# Patient Record
Sex: Female | Born: 1975 | Race: Black or African American | Hispanic: No | Marital: Single | Smoking: Never smoker
Health system: Southern US, Community
[De-identification: ages and names within clinical notes are randomized; demographics above are authoritative.]

## PROBLEM LIST (undated history)

## (undated) DIAGNOSIS — T8859XA Other complications of anesthesia, initial encounter: Secondary | ICD-10-CM

## (undated) DIAGNOSIS — M1990 Unspecified osteoarthritis, unspecified site: Secondary | ICD-10-CM

## (undated) DIAGNOSIS — G4730 Sleep apnea, unspecified: Secondary | ICD-10-CM

## (undated) DIAGNOSIS — J40 Bronchitis, not specified as acute or chronic: Secondary | ICD-10-CM

## (undated) DIAGNOSIS — I10 Essential (primary) hypertension: Secondary | ICD-10-CM

## (undated) HISTORY — PX: KNEE SURGERY: SHX244

---

## 2008-02-24 ENCOUNTER — Ambulatory Visit: Payer: Self-pay | Admitting: Internal Medicine

## 2008-03-01 ENCOUNTER — Ambulatory Visit: Payer: Self-pay | Admitting: *Deleted

## 2008-03-27 ENCOUNTER — Emergency Department (HOSPITAL_COMMUNITY): Admission: EM | Admit: 2008-03-27 | Discharge: 2008-03-27 | Payer: Self-pay | Admitting: Emergency Medicine

## 2008-05-24 ENCOUNTER — Ambulatory Visit: Payer: Self-pay | Admitting: Internal Medicine

## 2008-08-11 ENCOUNTER — Emergency Department (HOSPITAL_COMMUNITY): Admission: EM | Admit: 2008-08-11 | Discharge: 2008-08-11 | Payer: Self-pay | Admitting: Emergency Medicine

## 2008-10-11 ENCOUNTER — Ambulatory Visit: Payer: Self-pay | Admitting: Internal Medicine

## 2008-10-14 ENCOUNTER — Ambulatory Visit: Payer: Self-pay | Admitting: Internal Medicine

## 2008-12-09 ENCOUNTER — Ambulatory Visit: Payer: Self-pay | Admitting: Internal Medicine

## 2009-01-16 ENCOUNTER — Emergency Department (HOSPITAL_COMMUNITY): Admission: EM | Admit: 2009-01-16 | Discharge: 2009-01-16 | Payer: Self-pay | Admitting: Family Medicine

## 2009-05-26 ENCOUNTER — Encounter: Payer: Self-pay | Admitting: *Deleted

## 2009-08-10 ENCOUNTER — Emergency Department (HOSPITAL_COMMUNITY): Admission: EM | Admit: 2009-08-10 | Discharge: 2009-08-10 | Payer: Self-pay | Admitting: Family Medicine

## 2010-10-08 LAB — URINE CULTURE: Colony Count: 6000

## 2010-10-08 LAB — POCT PREGNANCY, URINE: Preg Test, Ur: NEGATIVE

## 2010-10-08 LAB — POCT URINALYSIS DIP (DEVICE): pH: 5.5 (ref 5.0–8.0)

## 2010-10-30 LAB — POCT URINALYSIS DIP (DEVICE)
Nitrite: NEGATIVE
Protein, ur: NEGATIVE mg/dL
pH: 7 (ref 5.0–8.0)

## 2010-11-06 LAB — URINE CULTURE: Colony Count: 30000

## 2010-11-06 LAB — POCT URINALYSIS DIP (DEVICE)
Nitrite: NEGATIVE
Specific Gravity, Urine: 1.015 (ref 1.005–1.030)
Urobilinogen, UA: 0.2 mg/dL (ref 0.0–1.0)
pH: 5 (ref 5.0–8.0)

## 2012-06-17 ENCOUNTER — Encounter (HOSPITAL_COMMUNITY): Payer: Self-pay | Admitting: Emergency Medicine

## 2012-06-17 ENCOUNTER — Emergency Department (HOSPITAL_COMMUNITY)
Admission: EM | Admit: 2012-06-17 | Discharge: 2012-06-17 | Disposition: A | Discharge status: Home / Self Care | Payer: No Typology Code available for payment source | Attending: Emergency Medicine | Admitting: Emergency Medicine

## 2012-06-17 DIAGNOSIS — I10 Essential (primary) hypertension: Secondary | ICD-10-CM | POA: Insufficient documentation

## 2012-06-17 DIAGNOSIS — M545 Low back pain, unspecified: Secondary | ICD-10-CM | POA: Insufficient documentation

## 2012-06-17 DIAGNOSIS — S4980XA Other specified injuries of shoulder and upper arm, unspecified arm, initial encounter: Secondary | ICD-10-CM | POA: Insufficient documentation

## 2012-06-17 DIAGNOSIS — S46909A Unspecified injury of unspecified muscle, fascia and tendon at shoulder and upper arm level, unspecified arm, initial encounter: Secondary | ICD-10-CM | POA: Insufficient documentation

## 2012-06-17 DIAGNOSIS — Z79899 Other long term (current) drug therapy: Secondary | ICD-10-CM | POA: Insufficient documentation

## 2012-06-17 DIAGNOSIS — Y92410 Unspecified street and highway as the place of occurrence of the external cause: Secondary | ICD-10-CM | POA: Insufficient documentation

## 2012-06-17 DIAGNOSIS — Y9389 Activity, other specified: Secondary | ICD-10-CM | POA: Insufficient documentation

## 2012-06-17 HISTORY — DX: Essential (primary) hypertension: I10

## 2012-06-17 MED ORDER — HYDROCODONE-ACETAMINOPHEN 5-325 MG PO TABS: 1.0000 | ORAL_TABLET | ORAL | Status: DC | PRN

## 2012-06-17 MED ORDER — CYCLOBENZAPRINE HCL 10 MG PO TABS: 10.0000 mg | ORAL_TABLET | Freq: Three times a day (TID) | ORAL | Status: DC | PRN
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
## 2012-06-17 NOTE — ED Notes (Signed)
Lower back "I'm sitting here and I feel a pulling in the low back". Dull ache in low back
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
## 2012-08-04 DIAGNOSIS — Z8709 Personal history of other diseases of the respiratory system: Secondary | ICD-10-CM | POA: Insufficient documentation

## 2012-08-04 DIAGNOSIS — R0789 Other chest pain: Secondary | ICD-10-CM | POA: Insufficient documentation

## 2012-08-04 DIAGNOSIS — Z79899 Other long term (current) drug therapy: Secondary | ICD-10-CM | POA: Insufficient documentation

## 2012-08-04 DIAGNOSIS — I10 Essential (primary) hypertension: Secondary | ICD-10-CM | POA: Insufficient documentation

## 2012-08-05 ENCOUNTER — Emergency Department (HOSPITAL_COMMUNITY)
Admit: 2012-08-05 | Discharge: 2012-08-05 | Disposition: A | Discharge status: Home / Self Care | Payer: BC Managed Care – PPO | Attending: Emergency Medicine | Admitting: Emergency Medicine

## 2012-08-05 ENCOUNTER — Encounter (HOSPITAL_COMMUNITY): Payer: Self-pay | Admitting: *Deleted

## 2012-08-05 ENCOUNTER — Emergency Department (HOSPITAL_COMMUNITY): Payer: BC Managed Care – PPO

## 2012-08-05 ENCOUNTER — Emergency Department (HOSPITAL_COMMUNITY)
Admission: EM | Admit: 2012-08-05 | Discharge: 2012-08-05 | Disposition: A | Discharge status: Home / Self Care | Payer: BC Managed Care – PPO | Attending: Emergency Medicine | Admitting: Emergency Medicine

## 2012-08-05 DIAGNOSIS — R079 Chest pain, unspecified: Secondary | ICD-10-CM

## 2012-08-05 HISTORY — DX: Bronchitis, not specified as acute or chronic: J40

## 2012-08-05 LAB — POCT I-STAT, CHEM 8
Chloride: 102 mEq/L (ref 96–112)
Glucose, Bld: 84 mg/dL (ref 70–99)
HCT: 35 % — ABNORMAL LOW (ref 36.0–46.0)
Hemoglobin: 11.9 g/dL — ABNORMAL LOW (ref 12.0–15.0)
Potassium: 3.3 mEq/L — ABNORMAL LOW (ref 3.5–5.1)
Sodium: 140 mEq/L (ref 135–145)

## 2012-08-05 LAB — CBC
HCT: 33.6 % — ABNORMAL LOW (ref 36.0–46.0)
Hemoglobin: 11.2 g/dL — ABNORMAL LOW (ref 12.0–15.0)
RDW: 15.2 % (ref 11.5–15.5)
WBC: 15.5 10*3/uL — ABNORMAL HIGH (ref 4.0–10.5)

## 2012-08-05 MED ORDER — ALBUTEROL SULFATE HFA 108 (90 BASE) MCG/ACT IN AERS: 1.0000 | INHALATION_SPRAY | Freq: Four times a day (QID) | RESPIRATORY_TRACT | Status: AC | PRN

## 2012-08-05 MED ORDER — IBUPROFEN 800 MG PO TABS: 800.0000 mg | ORAL_TABLET | Freq: Three times a day (TID) | ORAL | Status: AC

## 2012-08-05 MED ORDER — IOHEXOL 350 MG/ML SOLN
100.0000 mL | Freq: Once | INTRAVENOUS | Status: AC | PRN
  Administered 2012-08-05: 100 mL via INTRAVENOUS

## 2012-08-05 MED ORDER — ALBUTEROL SULFATE (5 MG/ML) 0.5% IN NEBU
5.0000 mg | INHALATION_SOLUTION | Freq: Once | RESPIRATORY_TRACT | Status: AC
Start: 2012-08-05 — End: 2012-08-05
  Administered 2012-08-05: 5 mg via RESPIRATORY_TRACT
  Filled 2012-08-05: qty 1

## 2012-08-05 NOTE — ED Provider Notes (Signed)
History     CSN: [id number]  Arrival date & time 08/04/12  2348   First MD Initiated Contact with Patient 08/05/12 [phone]      Chief Complaint  Patient presents with  . Shortness of Breath    (Consider location/radiation/quality/duration/timing/severity/associated sxs/prior treatment) HPI Hx per PT, SOB with CP, R sided pain sharp in quality, also described as burning. No leg pain or swelling. No known alleviating factors, no F/C, slight dry cough. Brother has sarcoisodis. No FH or DVT/ PE. MOD in severity but now very mild and PT declines and medications.  Past Medical History  Diagnosis Date  . Hypertension   . Bronchitis     Past Surgical History  Procedure Date  . Knee surgery     "titanium rod in left knee"    History reviewed. No pertinent family history.  History  Substance Use Topics  . Smoking status: Never Smoker   . Smokeless tobacco: Not on file  . Alcohol Use: Yes    OB History    Grav Para Term Preterm Abortions TAB SAB Ect Mult Living                  Review of Systems  Constitutional: Negative for fever and chills.  HENT: Negative for neck pain and neck stiffness.   Eyes: Negative for pain.  Respiratory: Positive for shortness of breath.   Cardiovascular: Positive for chest pain.  Gastrointestinal: Negative for abdominal pain.  Genitourinary: Negative for dysuria.  Musculoskeletal: Negative for back pain.  Skin: Negative for rash.  Neurological: Negative for headaches.  All other systems reviewed and are negative.    Allergies  Other  Home Medications   Current Outpatient Rx  Name  Route  Sig  Dispense  Refill  . SIMVASTATIN 20 MG PO TABS   Oral   Take 20 mg by mouth every evening.         Marland Kitchen VALSARTAN-HYDROCHLOROTHIAZIDE 160-25 MG PO TABS   Oral   Take 1 tablet by mouth daily.           BP 123/84  Pulse 95  Temp 98.4 F (36.9 C) (Oral)  Resp 18  SpO2 95%  Physical Exam  Constitutional: She is oriented to person,  place, and time. She appears well-developed and well-nourished.  HENT:  Head: Normocephalic and atraumatic.  Eyes: Conjunctivae normal and EOM are normal. Pupils are equal, round, and reactive to light.  Neck: Trachea normal. Neck supple. No thyromegaly present.  Cardiovascular: Normal rate, regular rhythm, S1 normal, S2 normal and normal pulses.     No systolic murmur is present   No diastolic murmur is present  Pulses:      Radial pulses are 2+ on the right side, and 2+ on the left side.  Pulmonary/Chest: Effort normal and breath sounds normal. She has no wheezes. She has no rhonchi. She has no rales. She exhibits no tenderness.  Abdominal: Soft. Normal appearance and bowel sounds are normal. There is no tenderness. There is no CVA tenderness and negative Murphy's sign.  Musculoskeletal: Normal range of motion. She exhibits no edema and no tenderness.       BLE:s Calves nontender, no cords or erythema, negative Homans sign  Neurological: She is alert and oriented to person, place, and time. She has normal strength. No cranial nerve deficit or sensory deficit. GCS eye subscore is 4. GCS verbal subscore is 5. GCS motor subscore is 6.  Skin: Skin is warm and dry. No rash noted.  She is not diaphoretic.  Psychiatric: Her speech is normal.       Cooperative and appropriate    ED Course  Procedures (including critical care time)  Results for orders placed during the hospital encounter of 08/05/12  CBC      Component Value Range   WBC 15.5 (*) 4.0 - 10.5 K/uL   RBC 4.25  3.87 - 5.11 MIL/uL   Hemoglobin 11.2 (*) 12.0 - 15.0 g/dL   HCT 16.1 (*) 09.6 - 04.5 %   MCV 79.1  78.0 - 100.0 fL   MCH 26.4  26.0 - 34.0 pg   MCHC 33.3  30.0 - 36.0 g/dL   RDW 40.9  81.1 - 91.4 %   Platelets 398  150 - 400 K/uL  POCT I-STAT, CHEM 8      Component Value Range   Sodium 140  135 - 145 mEq/L   Potassium 3.3 (*) 3.5 - 5.1 mEq/L   Chloride 102  96 - 112 mEq/L   BUN 20  6 - 23 mg/dL   Creatinine, Ser  7.82  0.50 - 1.10 mg/dL   Glucose, Bld 84  70 - 99 mg/dL   Calcium, Ion 9.56  2.13 - 1.23 mmol/L   TCO2 27  0 - 100 mmol/L   Hemoglobin 11.9 (*) 12.0 - 15.0 g/dL   HCT 08.6 (*) 57.8 - 46.9 %   Dg Chest 2 View  08/05/2012  *RADIOLOGY REPORT*  Clinical Data: Cough, shortness of breath.  CHEST - 2 VIEW  Comparison: None.  Findings: Mild perihilar interstitial prominence.  No focal infiltrate.  Heart size normal.  No effusion.  Regional bones unremarkable.  IMPRESSION:  Mild perihilar interstitial prominence, of uncertain chronicity.   Original Report Authenticated By: D. Hassell III, MD    Ct Angio Chest Pe W/cm &/or Wo Cm  08/05/2012  *RADIOLOGY REPORT*  Clinical Data: Tingling in the lower legs and pain in the right rib cage.  Shortness of breath.  CT ANGIOGRAPHY CHEST  Technique:  Multidetector CT imaging of the chest using the standard protocol during bolus administration of intravenous contrast. Multiplanar reconstructed images including MIPs were obtained and reviewed to evaluate the vascular anatomy.  Contrast: OMNIPAQUE IOHEXOL 350 MG/ML SOLN  Comparison: None.  Findings: Technically adequate study with moderately good opacification of the central and segmental pulmonary arteries.  No focal filling defects.  No evidence of significant central pulmonary emboli.  Normal heart size.  Normal caliber thoracic aorta.  Anterior mediastinal soft tissue is likely thymus.  Esophagus is decompressed.  No significant lymphadenopathy in the chest. Visualized portions of the upper abdominal organs are unremarkable. No pleural effusion.  No focal airspace consolidation.  Airways appear patent.  No pneumothorax.  Degenerative changes in the spine.  IMPRESSION: No evidence of significant pulmonary embolus.   Original Report Authenticated By: Burman Nieves, M.D.      Date: 08/05/2012  Rate: 96  Rhythm: normal sinus rhythm  QRS Axis: normal  Intervals: normal  ST/T Wave abnormalities: nonspecific ST/T  changes  Conduction Disutrbances:none  Narrative Interpretation:   Old EKG Reviewed: none available  Recheck after albuterol - feeling better, no PE on Ct scan. Plan d/c home with referrals provided. PT states understanding all d/c and f/u instructions.   MDM  SOB/ intermittent CP improved with albuterol. Work up as above. CT, CXR, ECG and labs reviewed. Plan outpatient follow up.         Sunnie Nielsen, MD 08/06/12 [phone]

## 2012-08-05 NOTE — ED Notes (Signed)
Pt transported to CT ?

## 2012-08-05 NOTE — ED Notes (Signed)
Pt states she was recently treated for bronchitis.  Today, c/o tingling in "lower lungs" and pain in right "rib cage".

## 2017-01-25 ENCOUNTER — Encounter (HOSPITAL_COMMUNITY): Payer: Self-pay | Admitting: Emergency Medicine

## 2017-01-25 ENCOUNTER — Emergency Department (HOSPITAL_COMMUNITY)
Admission: EM | Admit: 2017-01-25 | Discharge: 2017-01-26 | Disposition: A | Discharge status: Home / Self Care | Payer: BC Managed Care – PPO | Attending: Emergency Medicine | Admitting: Emergency Medicine

## 2017-01-25 DIAGNOSIS — K219 Gastro-esophageal reflux disease without esophagitis: Secondary | ICD-10-CM | POA: Insufficient documentation

## 2017-01-25 DIAGNOSIS — I10 Essential (primary) hypertension: Secondary | ICD-10-CM | POA: Diagnosis not present

## 2017-01-25 DIAGNOSIS — R079 Chest pain, unspecified: Secondary | ICD-10-CM | POA: Diagnosis present

## 2017-01-25 DIAGNOSIS — Z79899 Other long term (current) drug therapy: Secondary | ICD-10-CM | POA: Diagnosis not present

## 2017-01-25 MED ORDER — LIDOCAINE VISCOUS 2 % MT SOLN
15.0000 mL | Freq: Once | OROMUCOSAL | Status: AC
  Administered 2017-01-25: 15 mL via OROMUCOSAL
  Filled 2017-01-25: qty 15

## 2017-01-25 MED ORDER — LIDOCAINE 5 % EX PTCH
1.0000 | MEDICATED_PATCH | CUTANEOUS | Status: DC
  Administered 2017-01-25: 1 via TRANSDERMAL
  Filled 2017-01-25: qty 1

## 2017-01-25 MED ORDER — ALUM & MAG HYDROXIDE-SIMETH 200-200-20 MG/5ML PO SUSP
30.0000 mL | Freq: Once | ORAL | Status: AC
  Administered 2017-01-25: 30 mL via ORAL
  Filled 2017-01-25: qty 30

## 2017-01-25 MED ORDER — PANTOPRAZOLE SODIUM 20 MG PO TBEC: 40.0000 mg | DELAYED_RELEASE_TABLET | Freq: Every day | ORAL | 0 refills | Status: AC

## 2017-01-25 NOTE — ED Triage Notes (Signed)
Pt c/o upper central CP radiating through to back and down ribs and up R neck, intermittent x 2-3 days, worse this afternoon. More severe with position changes, +belching, +nausea, +shob. Pt has been taking Tramadol for chronic hip pain. Pt appears very uncomfortable in triage.,

## 2017-01-25 NOTE — ED Notes (Signed)
ED Provider at bedside. 

## 2017-01-25 NOTE — ED Provider Notes (Signed)
[facility]-EMERGENCY DEPT Provider Note   CSN: [id number] Arrival date & time: 01/25/17  2117     History   Chief Complaint Chief Complaint  Patient presents with  . Chest Pain    HPI Hilton Sinclairamara Montez MoritaCarter is a 41 y.o. female.  The history is provided by the patient.  Illness  This is a new problem. The current episode started more than 2 days ago. The problem occurs daily. The problem has not changed since onset.Associated symptoms include chest pain and shortness of breath. Pertinent negatives include no abdominal pain and no headaches. Associated symptoms comments: Intermittent substernal chest pain with associated belching, back pain, right upper rib pain. Exacerbated by: Palpation, movement. Relieved by: Nothing tried. She has tried nothing for the symptoms.    Past Medical History:  Diagnosis Date  . Bronchitis   . Hypertension     There are no active problems to display for this patient.   Past Surgical History:  Procedure Laterality Date  . KNEE SURGERY     "titanium rod in left knee"    OB History    No data available       Home Medications    Prior to Admission medications   Medication Sig Start Date End Date Taking? Authorizing Provider  ibuprofen (ADVIL,MOTRIN) 800 MG tablet Take 1 tablet (800 mg total) by mouth 3 (three) times daily. 08/05/12  Yes Sunnie Nielsenpitz, Brian, MD  Multiple Vitamin (MULTIVITAMIN) tablet Take 1 tablet by mouth daily.   Yes [provider]  traMADol (ULTRAM) 50 MG tablet Take 50 mg by mouth daily as needed.  01/16/17  Yes [provider]  valsartan-hydrochlorothiazide (DIOVAN-HCT) 160-25 MG per tablet Take 1 tablet by mouth daily.   Yes [provider]  albuterol (PROVENTIL HFA;VENTOLIN HFA) 108 (90 BASE) MCG/ACT inhaler Inhale 1-2 puffs into the lungs every 6 (six) hours as needed for wheezing. Patient not taking: Reported on 01/25/2017 08/05/12   Sunnie Nielsenpitz, Brian, MD  pantoprazole (PROTONIX) 20 MG tablet Take 2 tablets (40 mg  total) by mouth daily. 01/25/17   Orson Slickolson, Daleysa Kristiansen, MD    Family History No family history on file.  Social History Social History  Substance Use Topics  . Smoking status: Never Smoker  . Smokeless tobacco: Never Used  . Alcohol use Yes     Allergies   Other   Review of Systems Review of Systems  Constitutional: Negative for chills and fever.  HENT: Negative for ear pain and sore throat.   Eyes: Negative for pain and visual disturbance.  Respiratory: Positive for shortness of breath. Negative for cough.   Cardiovascular: Positive for chest pain. Negative for palpitations.  Gastrointestinal: Negative for abdominal pain, blood in stool, constipation, diarrhea, nausea and vomiting.  Genitourinary: Negative for dysuria and hematuria.  Musculoskeletal: Positive for back pain and myalgias. Negative for arthralgias, gait problem, joint swelling and neck pain.  Skin: Negative for color change and rash.  Allergic/Immunologic: Negative for immunocompromised state.  Neurological: Negative for seizures, syncope and headaches.  Psychiatric/Behavioral: Positive for agitation.  All other systems reviewed and are negative.    Physical Exam Updated Vital Signs BP 134/78   Pulse 72   Temp 98.2 F (36.8 C) (Oral)   Resp 15   Ht 5\' 5"  (1.651 m)   Wt 127 kg (280 lb)   LMP 01/21/2017   SpO2 98%   BMI 46.59 kg/m   Physical Exam  Constitutional: She is oriented to person, place, and time. She appears well-developed and well-nourished.  No distress.  HENT:  Head: Normocephalic and atraumatic.  Right Ear: External ear normal.  Left Ear: External ear normal.  Mouth/Throat: Oropharynx is clear and moist.  Eyes: Conjunctivae and EOM are normal. Pupils are equal, round, and reactive to light.  Neck: Normal range of motion. Neck supple. No tracheal deviation present.  Cardiovascular: Normal rate, regular rhythm and normal heart sounds.   No murmur heard. Pulmonary/Chest: Effort normal and  breath sounds normal. No respiratory distress. She has no wheezes. She has no rales. She exhibits no tenderness.  Abdominal: Soft. Bowel sounds are normal. She exhibits no distension. There is no tenderness.  Musculoskeletal: Normal range of motion. She exhibits tenderness. She exhibits no edema.  Patient with right upper rib tenderness to palpation as well as left paraspinal thoracic region muscle knot that is tender to palpation.  Neurological: She is alert and oriented to person, place, and time.  Skin: Skin is warm and dry. She is not diaphoretic.  Psychiatric: She has a normal mood and affect.  Nursing note and vitals reviewed.    ED Treatments / Results  Labs (all labs ordered are listed, but only abnormal results are displayed) Labs Reviewed - No data to display  EKG  EKG Interpretation None       Radiology No results found.  Procedures Procedures (including critical care time)  Medications Ordered in ED Medications  lidocaine (LIDODERM) 5 % 1 patch (1 patch Transdermal Patch Applied 01/25/17 2255)  lidocaine (XYLOCAINE) 2 % viscous mouth solution 15 mL (15 mLs Mouth/Throat Given 01/25/17 2257)  alum & mag hydroxide-simeth (MAALOX/MYLANTA) 200-200-20 MG/5ML suspension 30 mL (30 mLs Oral Given 01/25/17 2256)     Initial Impression / Assessment and Plan / ED Course  I have reviewed the triage vital signs and the nursing notes.  Pertinent labs & imaging results that were available during my care of the patient were reviewed by me and considered in my medical decision making (see chart for details).     Ronni Osterberg is a 41 year old female with history of hypertension who is coming in today with several complaints. Patient states over the last 3 days she has had occasional sharp chest pains in the substernal area associated with the urge to belch. Also has had occasional shortness of breath, left-sided upper back pain and right upper rib pain. Patient states around 3 days  ago, she was adjusting her position in bed when all of a sudden she felt the back pain as well as right-sided rib pain. No nausea, vomiting, abdominal pain.  Exam patient sitting up in bed in no apparent distress. Vital signs are stable and within normal limits. Palpation of the left upper thoracic paraspinal area shows a muscle knot and it is tender to palpation and reproduces her pain exactly. The patient of the right upper rib cage also reproduces her pain. Cardiorespiratory exam unremarkable. Patient will be given a lidocaine patch for muscle knot as well as GI cocktail for her likely reflux.  EKG shows ventricular rate of 78 beats a minute, PR interval 164 ms, QRS duration 74 ms, QTC 420 ms. Normal sinus rhythm with no signs of ST segment changes or other signs of acute ischemia. Examination and history not consistent with ACS, do not believe troponin necessary also doubt PE, as she is perc negative. Doubt esophageal spasm, dissection, other acute intrathoracic or cardiac abnormality at this time. Exam consistent with reflux as well as musculoskeletal pain.  Upon reassessment, patient states she is feeling  better after placement of the Lidoderm patch as well as GI cocktail. States she did have 1 large episode of belching that felt like burning and did feel better after that. Exam consistent with GERD and she will be discharged with a prescription for PPI and follow up with primary care the next couple weeks for recheck. Also encouraged stretching for her back pain as well as over-the-counter Lidoderm patches. She was understanding and voiced agreement with plan and was comfortable with discharge home with outpatient follow-up. Stable at time of discharge.  Patient was seen with my attending, Dr. Deretha Emory, who voiced agreement and oversaw the evaluation and treatment of this patient.   [employer] illustrator was used in the creation of this note. If there are any errors or inconsistencies  needing clarification, please contact me directly.   Final Clinical Impressions(s) / ED Diagnoses   Final diagnoses:  Gastroesophageal reflux disease, esophagitis presence not specified    New Prescriptions New Prescriptions   PANTOPRAZOLE (PROTONIX) 20 MG TABLET    Take 2 tablets (40 mg total) by mouth daily.     Orson Slick, MD 01/25/17 1610    Vanetta Mulders, MD 01/25/17 [phone]

## 2017-12-02 ENCOUNTER — Encounter (HOSPITAL_COMMUNITY): Payer: Self-pay | Admitting: Emergency Medicine

## 2017-12-02 ENCOUNTER — Emergency Department (HOSPITAL_COMMUNITY)
Admission: EM | Admit: 2017-12-02 | Discharge: 2017-12-02 | Disposition: A | Discharge status: Home / Self Care | Payer: BC Managed Care – PPO | Attending: Emergency Medicine | Admitting: Emergency Medicine

## 2017-12-02 DIAGNOSIS — M5417 Radiculopathy, lumbosacral region: Secondary | ICD-10-CM

## 2017-12-02 DIAGNOSIS — M5432 Sciatica, left side: Secondary | ICD-10-CM | POA: Insufficient documentation

## 2017-12-02 DIAGNOSIS — I10 Essential (primary) hypertension: Secondary | ICD-10-CM | POA: Insufficient documentation

## 2017-12-02 DIAGNOSIS — M5416 Radiculopathy, lumbar region: Secondary | ICD-10-CM | POA: Diagnosis not present

## 2017-12-02 DIAGNOSIS — Z79899 Other long term (current) drug therapy: Secondary | ICD-10-CM | POA: Insufficient documentation

## 2017-12-02 DIAGNOSIS — M549 Dorsalgia, unspecified: Secondary | ICD-10-CM | POA: Diagnosis present

## 2017-12-02 MED ORDER — OXYCODONE-ACETAMINOPHEN 5-325 MG PO TABS
1.0000 | ORAL_TABLET | ORAL | Status: DC | PRN
  Administered 2017-12-02: 1 via ORAL
  Filled 2017-12-02: qty 1

## 2017-12-02 MED ORDER — PREDNISONE 10 MG PO TABS: 60.0000 mg | ORAL_TABLET | Freq: Every day | ORAL | 0 refills | Status: AC

## 2017-12-02 MED ORDER — OXYCODONE-ACETAMINOPHEN 5-325 MG PO TABS: 1.0000 | ORAL_TABLET | ORAL | 0 refills | Status: AC | PRN

## 2017-12-02 MED ORDER — PREDNISONE 20 MG PO TABS: 60.0000 mg | ORAL_TABLET | Freq: Once | ORAL | Status: DC

## 2017-12-02 MED ORDER — OXYCODONE-ACETAMINOPHEN 5-325 MG PO TABS: 1.0000 | ORAL_TABLET | Freq: Once | ORAL | Status: DC

## 2017-12-02 MED ORDER — ACETAMINOPHEN 325 MG PO TABS: 650.0000 mg | ORAL_TABLET | Freq: Once | ORAL | Status: DC

## 2017-12-02 MED ORDER — ACETAMINOPHEN 325 MG PO TABS: 650.0000 mg | ORAL_TABLET | Freq: Four times a day (QID) | ORAL | 0 refills | Status: AC | PRN

## 2017-12-02 MED ORDER — METHOCARBAMOL 500 MG PO TABS: 500.0000 mg | ORAL_TABLET | Freq: Two times a day (BID) | ORAL | 0 refills | Status: AC

## 2017-12-02 NOTE — Discharge Instructions (Signed)
We suspect that you are having sciatica or radiculopathy. We suspect that the culprit area is your lumbar spine. Please perform the exercises prescribed as much as he can tolerate. Please take the medicines prescribed for pain control. Call the orthopedist for the closest available appointment.  Return to the ER if there is any urinary incontinence, urinary retention, bowel incontinence, pins and needle sensation in the perineal area.

## 2017-12-02 NOTE — ED Provider Notes (Signed)
[facility] EMERGENCY DEPARTMENT Provider Note   CSN: [id number] Arrival date & time: 12/02/17  5621     History   Chief Complaint Chief Complaint  Patient presents with  . Leg Pain    HPI Brooke Weeks is a 42 y.o. female.  HPI  42 year old comes in with chief complaint of neck pain. 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  She states that 3 days ago she started having back pain which over time has gotten worse.  The pain is located in her left hip and it shoots down to her toes.  Pain is described as sharp, stabbing, burning and there is associated tingling. Pt has no associated urinary incontinence, urinary retention, bowel incontinence, pins and needle sensation in the perineal area.  She denies history of similar pain in the past.  Patient sees Dr. Jillyn Hidden and Dr. Malena Catholic for her hip problems and her back problems.  Past Medical History:  Diagnosis Date  . Bronchitis   . Hypertension     There are no active problems to display for this patient.   Past Surgical History:  Procedure Laterality Date  . KNEE SURGERY     "titanium rod in left knee"     OB History   None      Home Medications    Prior to Admission medications   Medication Sig Start Date End Date Taking? Authorizing Provider  acetaminophen (TYLENOL) 325 MG tablet Take 2 tablets (650 mg total) by mouth every 6 (six) hours as needed. 12/02/17   Derwood Kaplan, MD  albuterol (PROVENTIL HFA;VENTOLIN HFA) 108 (90 BASE) MCG/ACT inhaler Inhale 1-2 puffs into the lungs every 6 (six) hours as needed for wheezing. Patient not taking: Reported on 01/25/2017 08/05/12   Sunnie Nielsen, MD  ibuprofen (ADVIL,MOTRIN) 800 MG tablet Take 1 tablet (800 mg total) by mouth 3 (three) times daily. 08/05/12   Sunnie Nielsen, MD  methocarbamol (ROBAXIN) 500 MG tablet Take 1 tablet (500 mg total) by mouth 2 (two) times daily. 12/02/17   Derwood Kaplan, MD  Multiple Vitamin  (MULTIVITAMIN) tablet Take 1 tablet by mouth daily.    [provider]  oxyCODONE-acetaminophen (PERCOCET/ROXICET) 5-325 MG tablet Take 1 tablet by mouth every 4 (four) hours as needed for severe pain. 12/02/17   Derwood Kaplan, MD  pantoprazole (PROTONIX) 20 MG tablet Take 2 tablets (40 mg total) by mouth daily. 01/25/17   Orson Slick, MD  predniSONE (DELTASONE) 10 MG tablet Take 6 tablets (60 mg total) by mouth daily. 12/02/17   Derwood Kaplan, MD  traMADol (ULTRAM) 50 MG tablet Take 50 mg by mouth daily as needed.  01/16/17   [provider]  valsartan-hydrochlorothiazide (DIOVAN-HCT) 160-25 MG per tablet Take 1 tablet by mouth daily.    [provider]    Family History No family history on file.  Social History Social History   Tobacco Use  . Smoking status: Never Smoker  . Smokeless tobacco: Never Used  Substance Use Topics  . Alcohol use: Yes  . Drug use: No     Allergies   Other   Review of Systems Review of Systems  Constitutional: Positive for activity change.  Genitourinary: Negative for difficulty urinating.  Musculoskeletal: Positive for back pain.  Neurological: Positive for numbness.     Physical Exam Updated Vital Signs BP (!) 125/91 (BP Location: Right Arm)   Pulse 80   Temp 98.2 F (36.8 C) (Oral)   Resp 18  Ht 5' 9.25" (1.759 m)   Wt 125.2 kg (276 lb)   SpO2 99%   BMI 40.46 kg/m   Physical Exam  Constitutional: She is oriented to person, place, and time. She appears well-developed.  HENT:  Head: Normocephalic and atraumatic.  Eyes: EOM are normal.  Neck: Normal range of motion. Neck supple.  Cardiovascular: Normal rate.  Pulmonary/Chest: Effort normal.  Abdominal: Bowel sounds are normal.  Musculoskeletal:  Pt has tenderness over the lumbar region No step offs, no erythema. Pt has 1+ patellar reflex bilaterally -no hyperreflexia Able to discriminate between sharp and dull. Negative passive leg  raise Patient's symptoms get worse with active leg raise -and she is limited with her range of motion of the left lower extremity due to pain.   Neurological: She is alert and oriented to person, place, and time.  Skin: Skin is warm and dry.  Nursing note and vitals reviewed.    ED Treatments / Results  Labs (all labs ordered are listed, but only abnormal results are displayed) Labs Reviewed - No data to display  EKG None  Radiology No results found.  Procedures Procedures (including critical care time)  Medications Ordered in ED Medications  oxyCODONE-acetaminophen (PERCOCET/ROXICET) 5-325 MG per tablet 1 tablet (1 tablet Oral Given 12/02/17 9811)  oxyCODONE-acetaminophen (PERCOCET/ROXICET) 5-325 MG per tablet 1 tablet (has no administration in time range)  predniSONE (DELTASONE) tablet 60 mg (has no administration in time range)  acetaminophen (TYLENOL) tablet 650 mg (has no administration in time range)     Initial Impression / Assessment and Plan / ED Course  I have reviewed the triage vital signs and the nursing notes.  Pertinent labs & imaging results that were available during my care of the patient were reviewed by me and considered in my medical decision making (see chart for details).     42 year old comes in with chief complaint of back pain /hip pain, which is radiating to her foot. The pain i neuropathic in nature given that it is described as burning type pain with tingling and shooting type pain.  She has known history of DJD of the lumbar spine, and I think most likely she has a lumbar radiculopathy based on exam.  Her pain primarily starts at the hip, therefore the impingement could be the hip level as well.  No red flags to suggest cord compression at this time to suggest need for emergent MRI or emergent hernia reduction.  However, I have advised patient that her symptoms can progress to that level therefore she will return to the ER if her symptoms get  worse.    For now plan is to have pain control on board and have patient follow-up with her orthopedist as soon as possible.  Final Clinical Impressions(s) / ED Diagnoses   Final diagnoses:  Sciatica of left side  Radiculopathy of lumbosacral region    ED Discharge Orders        Ordered    acetaminophen (TYLENOL) 325 MG tablet  Every 6 hours PRN     12/02/17 0750    oxyCODONE-acetaminophen (PERCOCET/ROXICET) 5-325 MG tablet  Every 4 hours PRN     12/02/17 0750    predniSONE (DELTASONE) 10 MG tablet  Daily     12/02/17 0750    methocarbamol (ROBAXIN) 500 MG tablet  2 times daily     12/02/17 0750       Derwood Kaplan, MD 12/02/17 [id number]

## 2017-12-02 NOTE — ED Triage Notes (Signed)
Reports pain in left hip that shoots down the leg since Friday.  No relief with meloxicam.

## 2018-03-19 ENCOUNTER — Other Ambulatory Visit: Payer: Self-pay | Admitting: Geriatric Medicine

## 2018-03-19 ENCOUNTER — Ambulatory Visit
Admission: RE | Admit: 2018-03-19 | Discharge: 2018-03-19 | Disposition: A | Discharge status: Home / Self Care | Payer: BC Managed Care – PPO | Source: Ambulatory Visit | Attending: Geriatric Medicine | Admitting: Geriatric Medicine

## 2018-03-19 DIAGNOSIS — R0789 Other chest pain: Secondary | ICD-10-CM

## 2019-06-03 IMAGING — CR DG CHEST 2V
2 series · 2 of 2 positions shown · non-contrast
Comparison: Prior chest x-ray and CT scan of the chest 08/05/2012

CLINICAL DATA: 42-year-old female with right-sided chest wall pain
for the past 2-3 weeks. No known injury.

EXAM:
CHEST - 2 VIEW

[w chest pa *]
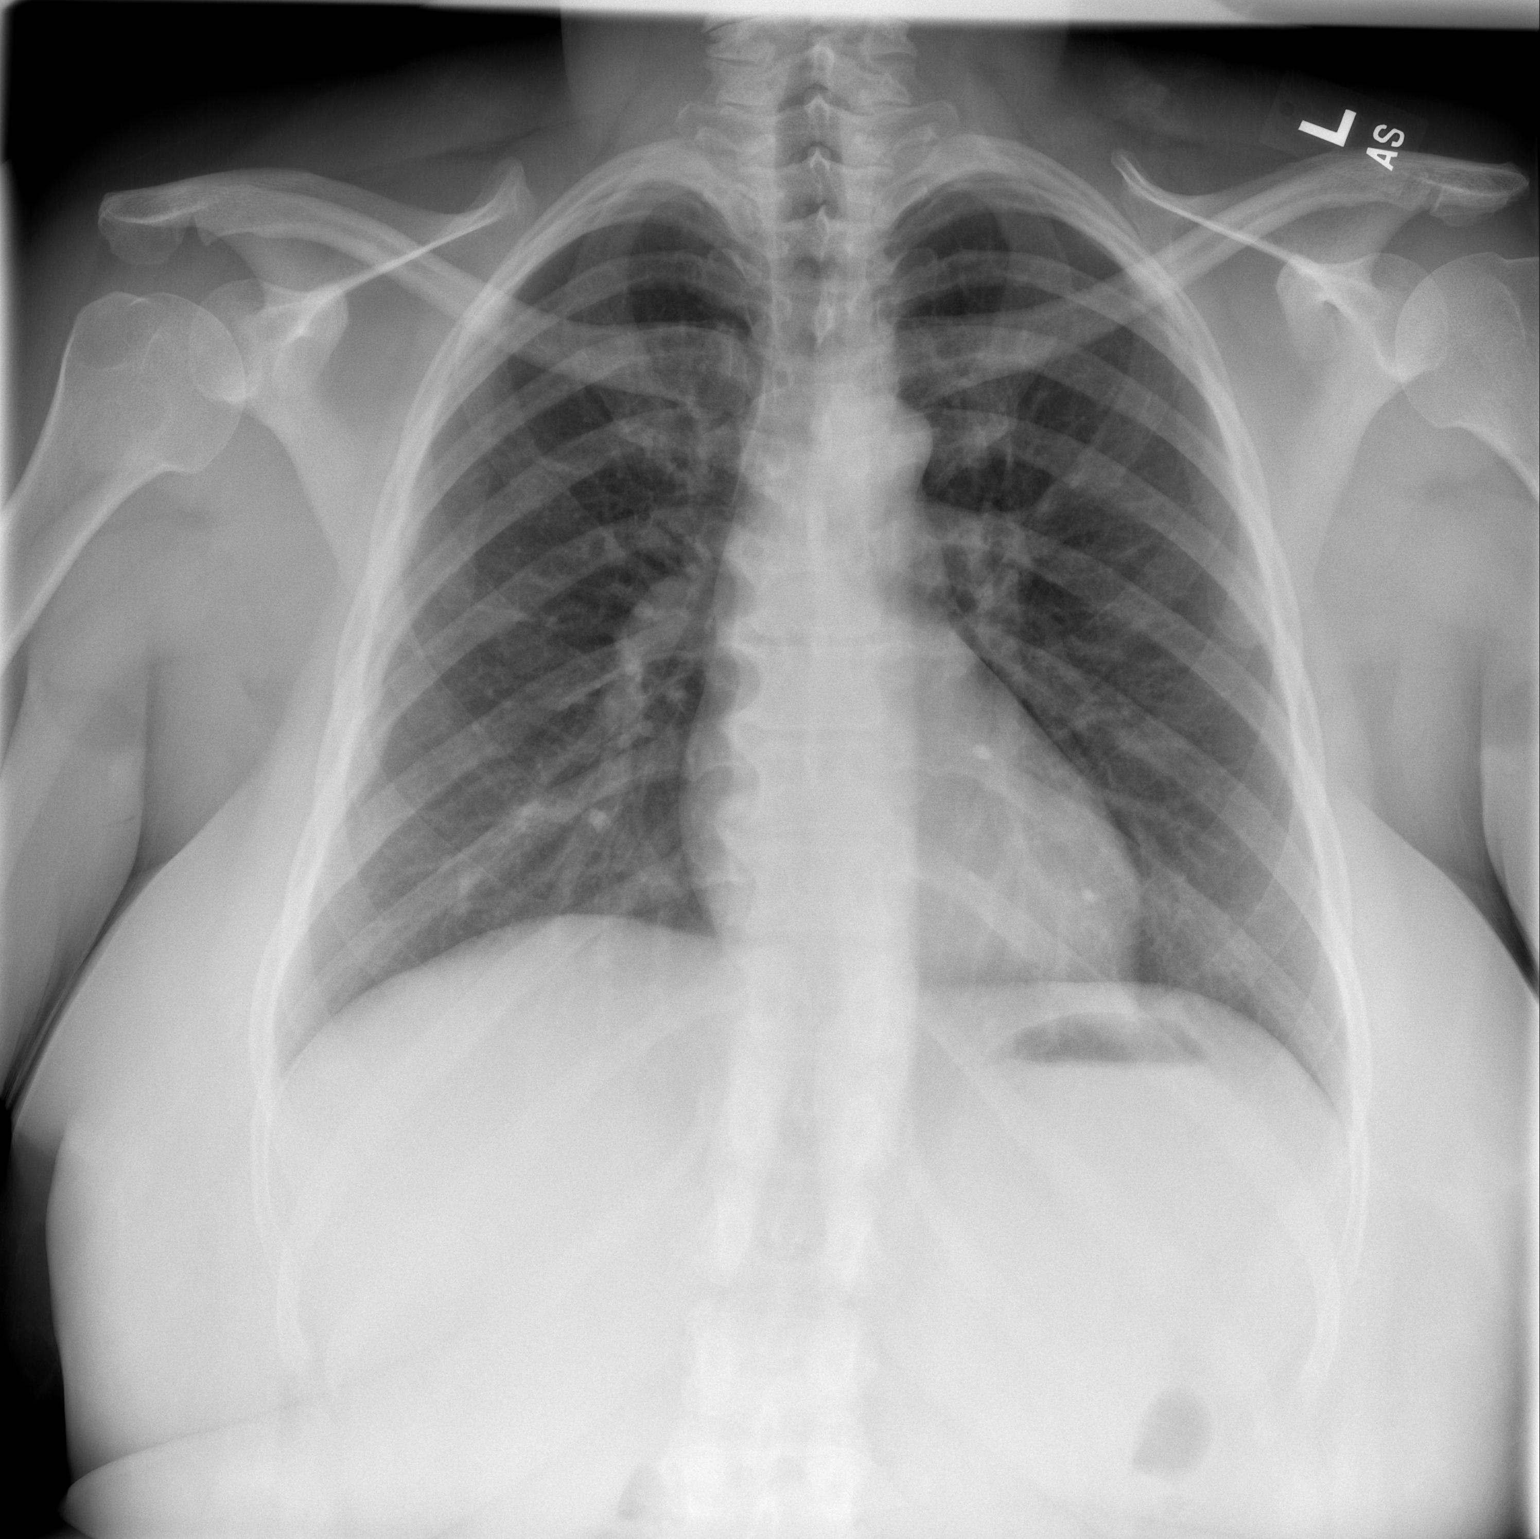

[w chest lat *]
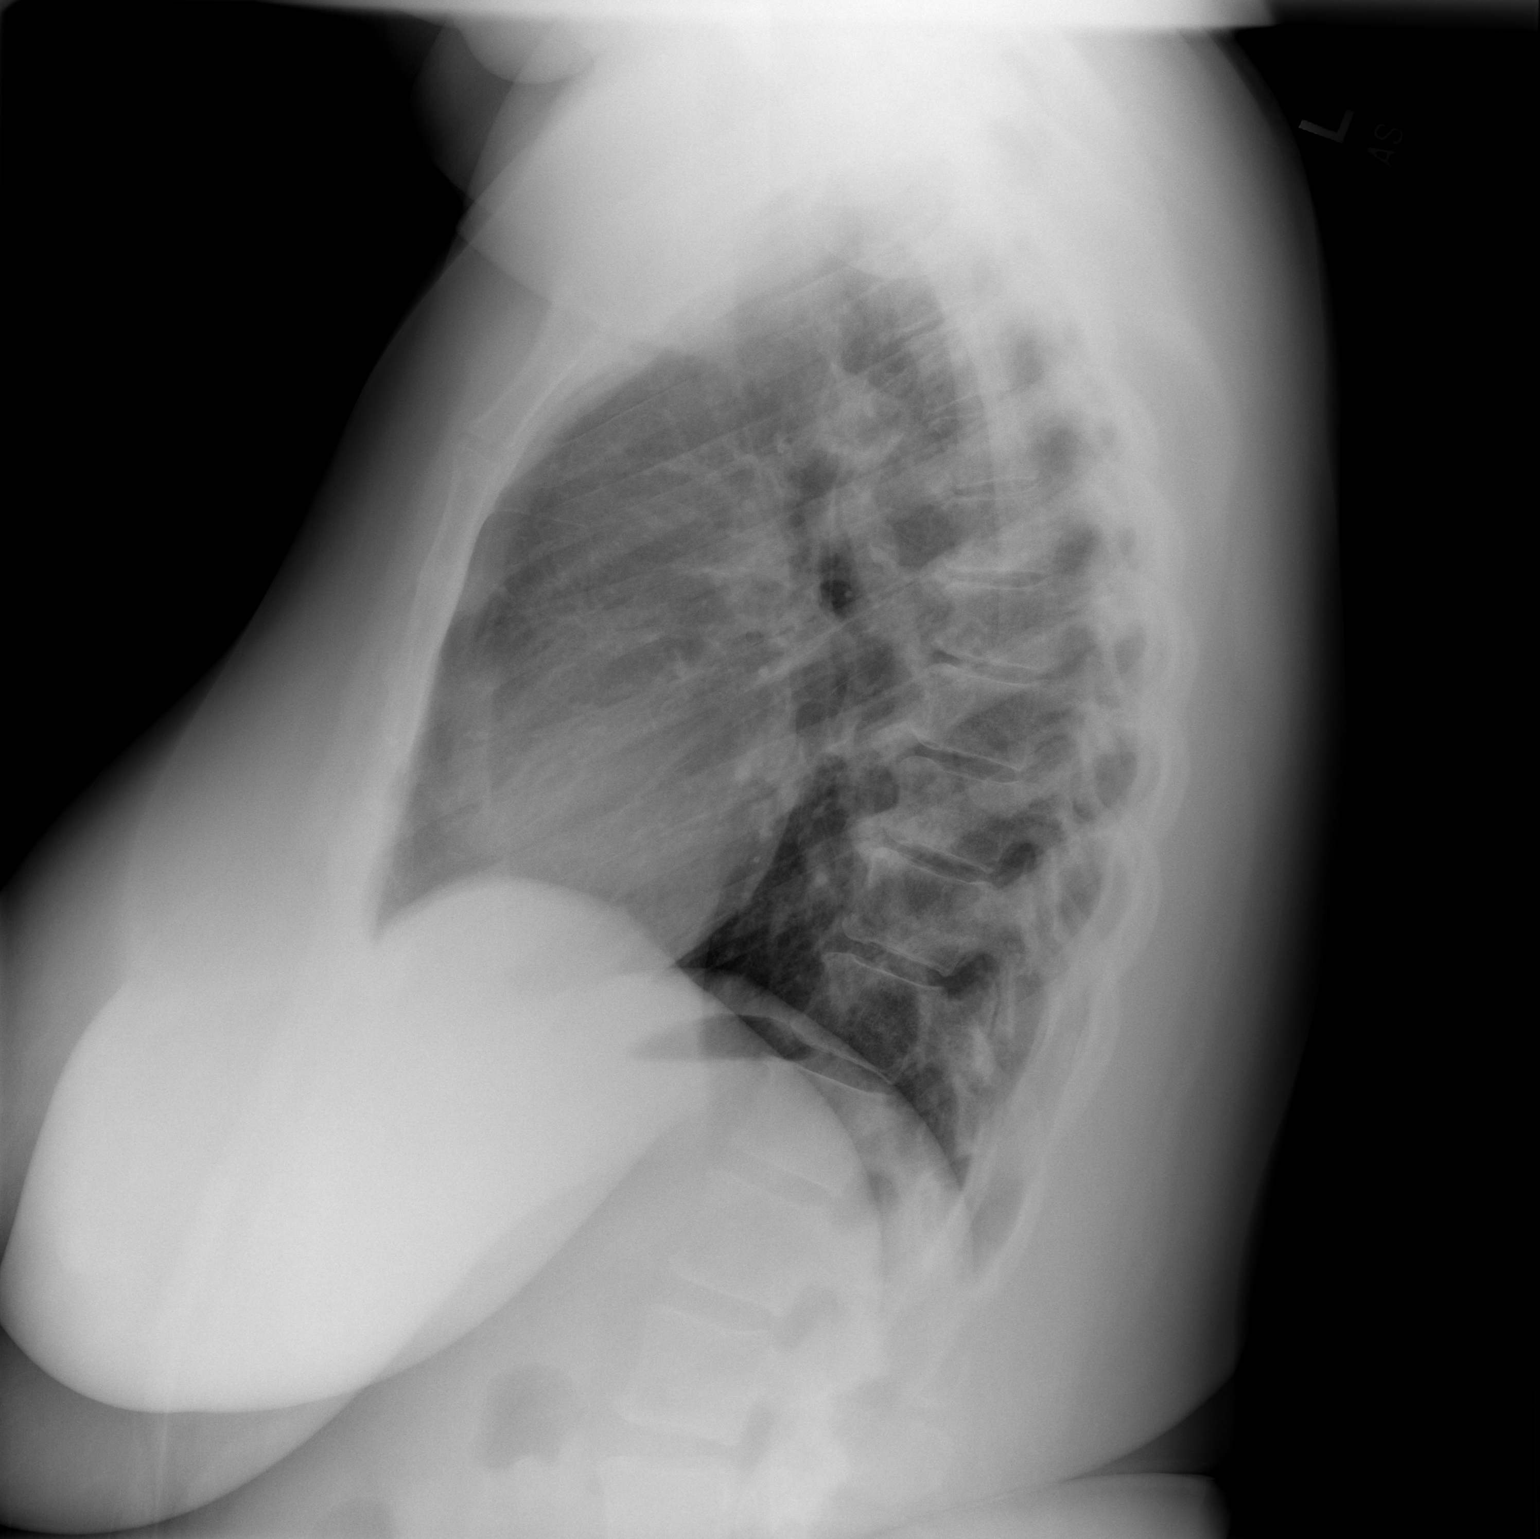

[2 of 2 positions shown; findings below may reference images not displayed]

FINDINGS: The lungs are clear and negative for focal airspace consolidation,
pulmonary edema or suspicious pulmonary nodule. No pleural effusion
or pneumothorax. Cardiac and mediastinal contours are within normal
limits. No acute fracture or lytic or blastic osseous lesions. The
visualized upper abdominal bowel gas pattern is unremarkable.
IMPRESSION: Negative chest x-ray.

## 2019-09-09 ENCOUNTER — Other Ambulatory Visit: Payer: Self-pay

## 2019-09-09 ENCOUNTER — Ambulatory Visit: Payer: BC Managed Care – PPO | Admitting: Podiatry

## 2019-09-09 ENCOUNTER — Other Ambulatory Visit: Payer: Self-pay | Admitting: Podiatry

## 2019-09-09 ENCOUNTER — Ambulatory Visit (INDEPENDENT_AMBULATORY_CARE_PROVIDER_SITE_OTHER): Payer: BC Managed Care – PPO

## 2019-09-09 DIAGNOSIS — M722 Plantar fascial fibromatosis: Secondary | ICD-10-CM

## 2019-09-09 DIAGNOSIS — M7661 Achilles tendinitis, right leg: Secondary | ICD-10-CM

## 2019-09-13 NOTE — Progress Notes (Signed)
   HPI: 44 y.o. female presenting today as a new patient with a chief complaint of burning bilateral foot pain, beginning in the arches extending to the plantar heels, that began about one year ago. She reports associated swelling. Applying pressure to the feet increases the pain. She has been taking Meloxicam for treatment with some temporary relief.  She also expresses concerned for possible nail fungus that has been present for the past few years. She reports associated thickening and discoloration of the nails. She has not had any treatment for these symptoms. She denies modifying factors. Patient is here for further evaluation and treatment.   Past Medical History:  Diagnosis Date  . Bronchitis   . Hypertension       Physical Exam: General: The patient is alert and oriented x3 in no acute distress.  Dermatology: Hyperkeratotic, discolored, thickened, onychodystrophy noted to nails 1-5 bilaterally. Skin is warm, dry and supple bilateral lower extremities. Negative for open lesions or macerations.  Vascular: Palpable pedal pulses bilaterally. No edema or erythema noted. Capillary refill within normal limits.  Neurological: Epicritic and protective threshold grossly intact bilaterally.   Musculoskeletal Exam: Pain on palpation noted to the posterior tubercle of the right calcaneus at the insertion of the Achilles tendon consistent with retrocalcaneal bursitis.  Pain with palpation noted to the bilateral heels along the plantar fascia.  Range of motion within normal limits. Muscle strength 5/5 in all muscle groups bilateral lower extremities.  Radiographic Exam:  Posterior calcaneal spur noted to the respective calcaneus on lateral view. No fracture or dislocation noted. Normal osseous mineralization noted.     Assessment: 1. Insertional Achilles tendinitis right 2. Plantar fasciitis bilateral  3. Onychomycosis nails 1-5 bilateral    Plan of Care:  1. Patient was evaluated.  Radiographs were reviewed today. 2. Injection of 0.5 mL Celestone Soluspan injected into the retrocalcaneal bursa. Care was taken to avoid direct injection into the Achilles tendon. 3. Continue taking Meloxicam 15 mg daily.  4. Appointment with Raiford Noble, Pedorthist, for custom molded orthotics.  5. Return to clinic in 3 months to initiate Lamisil for onychomycosis.    Patient having right hip replacement on October 28, 2019.    Felecia Shelling, DPM [facility]  Dr. Felecia Shelling, DPM    [address]                                        [place], [place] [address]                Office [phone]  Fax [phone]

## 2019-09-14 ENCOUNTER — Other Ambulatory Visit: Payer: BC Managed Care – PPO | Admitting: Orthotics

## 2019-09-23 ENCOUNTER — Inpatient Hospital Stay: Admit: 2019-09-23 | Payer: BC Managed Care – PPO | Admitting: Orthopedic Surgery

## 2019-09-23 SURGERY — ARTHROPLASTY, HIP, TOTAL, ANTERIOR APPROACH
Anesthesia: Choice | Site: Hip | Laterality: Right

## 2019-09-25 ENCOUNTER — Other Ambulatory Visit: Payer: BC Managed Care – PPO | Admitting: Orthotics

## 2019-09-25 ENCOUNTER — Telehealth: Payer: Self-pay | Admitting: Podiatry

## 2019-09-25 ENCOUNTER — Other Ambulatory Visit: Payer: Self-pay

## 2019-09-25 NOTE — Telephone Encounter (Signed)
Pt came in today to be measured for orthotics and after talking with Fenton Foy she is scheduled to have hip surgery at the beginning of April. Fenton Foy was concerned about the orthotics effecting her hip and wanted to see if we should wait until after hip surgery to get the orthotics.   Pt was also concerned about insurance and deductible.

## 2019-09-28 NOTE — Telephone Encounter (Signed)
Left message for pt that Dr Logan Bores is ok with waiting until after hip surgery to get the orthotics and to call to discuss further.

## 2019-10-19 NOTE — Patient Instructions (Signed)
DUE TO COVID-19 ONLY ONE VISITOR IS ALLOWED TO COME WITH YOU AND STAY IN THE WAITING ROOM ONLY DURING PRE OP AND PROCEDURE DAY OF SURGERY. THE 1 VISITOR MAY VISIT WITH YOU AFTER SURGERY IN YOUR PRIVATE ROOM DURING VISITING HOURS ONLY!  YOU NEED TO HAVE A COVID 19 TEST ON_4/3/21______ @___10 :15____, THIS TEST MUST BE DONE BEFORE SURGERY, COME  [address], [place]  , .  [facility]) ONCE YOUR COVID TEST IS COMPLETED, PLEASE BEGIN THE QUARANTINE INSTRUCTIONS AS OUTLINED IN YOUR HANDOUT.                Brooke Weeks    Your procedure is scheduled on: 10/28/19   Report to [facility] Main  Entrance   Report to admitting at  6:00 AM     Call this number if you have problems the morning of surgery [phone]   . BRUSH YOUR TEETH MORNING OF SURGERY AND RINSE YOUR MOUTH OUT, NO CHEWING GUM CANDY OR MINTS.   Do not eat food After Midnight.   YOU MAY HAVE CLEAR LIQUIDS FROM MIDNIGHT UNTIL 5:30AM.   At 5:30 AM Please finish the prescribed Pre-Surgery  drink.   Nothing by mouth after you finish the  drink !    Take these medicines the morning of surgery with A SIP OF WATER: None                                You may not have any metal on your body including hair pins and              piercings  Do not wear jewelry, make-up, lotions, powders or perfumes, deodorant             Do not wear nail polish on your fingernails.  Do not shave  48 hours prior to surgery.     Do not bring valuables to the hospital. [place] [facility] IS NOT             RESPONSIBLE   FOR VALUABLES.  Contacts, dentures or bridgework may not be worn into surgery.      Patients discharged the day of surgery will not be allowed to drive home. IF YOU ARE HAVING SURGERY AND GOING HOME THE SAME DAY, YOU MUST HAVE AN ADULT TO DRIVE YOU HOME AND BE WITH YOU FOR 24 HOURS. YOU MAY GO HOME BY TAXI OR UBER OR ORTHERWISE, BUT AN ADULT MUST ACCOMPANY YOU HOME AND STAY WITH YOU FOR 24  HOURS.  Name and phone number of your driver:  Special Instructions: N/A              Please read over the following fact sheets you were given: _____________________________________________________________________             [facility] - Preparing for Surgery Before surgery, you can play an important role .  Because skin is not sterile, your skin needs to be as free of germs as possible.   You can reduce the number of germs on your skin by washing with CHG (chlorahexidine gluconate) soap before surgery.   CHG is an antiseptic cleaner which kills germs and bonds with the skin to continue killing germs even after washing. Please DO NOT use if you have an allergy to CHG or antibacterial soaps.   If your skin becomes reddened/irritated stop using the CHG and inform your nurse when you arrive at  Short Stay. Do not shave (including legs and underarms) for at least 48 hours prior to the first CHG shower.   . Please follow these instructions carefully:  1.  Shower with CHG Soap the night before surgery and the  morning of Surgery.  2.  If you choose to wash your hair, wash your hair first as usual with your  normal  shampoo.  3.  After you shampoo, rinse your hair and body thoroughly to remove the  shampoo.                                        4.  Use CHG as you would any other liquid soap.  You can apply chg directly  to the skin and wash                       Gently with a scrungie or clean washcloth.  5.  Apply the CHG Soap to your body ONLY FROM THE NECK DOWN.   Do not use on face/ open                           Wound or open sores. Avoid contact with eyes, ears mouth and genitals (private parts).                       Wash face,  Genitals (private parts) with your normal soap.             6.  Wash thoroughly, paying special attention to the area where your surgery  will be performed.  7.  Thoroughly rinse your body with warm water from the neck down.  8.  DO NOT shower/wash with your  normal soap after using and rinsing off  the CHG Soap.             9.  Pat yourself dry with a clean towel.            10.  Wear clean pajamas.            11.  Place clean sheets on your bed the night of your first shower and do not  sleep with pets. Day of Surgery : Do not apply any lotions/deodorants the morning of surgery.  Please wear clean clothes to the hospital/surgery center.  FAILURE TO FOLLOW THESE INSTRUCTIONS MAY RESULT IN THE CANCELLATION OF YOUR SURGERY PATIENT SIGNATURE_________________________________  NURSE SIGNATURE__________________________________  ________________________________________________________________________   Brooke Weeks  An incentive spirometer is a tool that can help keep your lungs clear and active. This tool measures how well you are filling your lungs with each breath. Taking long deep breaths may help reverse or decrease the chance of developing breathing (pulmonary) problems (especially infection) following:  A long period of time when you are unable to move or be active. BEFORE THE PROCEDURE   If the spirometer includes an indicator to show your best effort, your nurse or respiratory therapist will set it to a desired goal.  If possible, sit up straight or lean slightly forward. Try not to slouch.  Hold the incentive spirometer in an upright position. INSTRUCTIONS FOR USE  1. Sit on the edge of your bed if possible, or sit up as far as you can in bed or on a chair. 2. Hold the incentive spirometer in an upright position.  3. Breathe out normally. 4. Place the mouthpiece in your mouth and seal your lips tightly around it. 5. Breathe in slowly and as deeply as possible, raising the piston or the ball toward the top of the column. 6. Hold your breath for 3-5 seconds or for as long as possible. Allow the piston or ball to fall to the bottom of the column. 7. Remove the mouthpiece from your mouth and breathe out normally. 8. Rest for a  few seconds and repeat Steps 1 through 7 at least 10 times every 1-2 hours when you are awake. Take your time and take a few normal breaths between deep breaths. 9. The spirometer may include an indicator to show your best effort. Use the indicator as a goal to work toward during each repetition. 10. After each set of 10 deep breaths, practice coughing to be sure your lungs are clear. If you have an incision (the cut made at the time of surgery), support your incision when coughing by placing a pillow or rolled up towels firmly against it. Once you are able to get out of bed, walk around indoors and cough well. You may stop using the incentive spirometer when instructed by your caregiver.  RISKS AND COMPLICATIONS  Take your time so you do not get dizzy or light-headed.  If you are in pain, you may need to take or ask for pain medication before doing incentive spirometry. It is harder to take a deep breath if you are having pain. AFTER USE  Rest and breathe slowly and easily.  It can be helpful to keep track of a log of your progress. Your caregiver can provide you with a simple table to help with this. If you are using the spirometer at home, follow these instructions: SEEK MEDICAL CARE IF:   You are having difficultly using the spirometer.  You have trouble using the spirometer as often as instructed.  Your pain medication is not giving enough relief while using the spirometer.  You develop fever of 100.5 F (38.1 C) or higher. SEEK IMMEDIATE MEDICAL CARE IF:   You cough up bloody sputum that had not been present before.  You develop fever of 102 F (38.9 C) or greater.  You develop worsening pain at or near the incision site. MAKE SURE YOU:   Understand these instructions.  Will watch your condition.  Will get help right away if you are not doing well or get worse. Document Released: 11/19/2006 Document Revised: 10/01/2011 Document Reviewed: 01/20/2007 Exit[employer] Patient  Information 2014 [employer], [place].   ________________________________________________________________________  WHAT IS A BLOOD TRANSFUSION? Blood Transfusion Information  A transfusion is the replacement of blood or some of its parts. Blood is made up of multiple cells which provide different functions.  Red blood cells carry oxygen and are used for blood loss replacement.  White blood cells fight against infection.  Platelets control bleeding.  Plasma helps clot blood.  Other blood products are available for specialized needs, such as hemophilia or other clotting disorders. BEFORE THE TRANSFUSION  Who gives blood for transfusions?   Healthy volunteers who are fully evaluated to make sure their blood is safe. This is blood bank blood. Transfusion therapy is the safest it has ever been in the practice of medicine. Before blood is taken from a donor, a complete history is taken to make sure that person has no history of diseases nor engages in risky social behavior (examples are intravenous drug use or sexual activity with multiple partners).  The donor's travel history is screened to minimize risk of transmitting infections, such as malaria. The donated blood is tested for signs of infectious diseases, such as HIV and hepatitis. The blood is then tested to be sure it is compatible with you in order to minimize the chance of a transfusion reaction. If you or a relative donates blood, this is often done in anticipation of surgery and is not appropriate for emergency situations. It takes many days to process the donated blood. RISKS AND COMPLICATIONS Although transfusion therapy is very safe and saves many lives, the main dangers of transfusion include:   Getting an infectious disease.  Developing a transfusion reaction. This is an allergic reaction to something in the blood you were given. Every precaution is taken to prevent this. The decision to have a blood transfusion has been considered  carefully by your caregiver before blood is given. Blood is not given unless the benefits outweigh the risks. AFTER THE TRANSFUSION  Right after receiving a blood transfusion, you will usually feel much better and more energetic. This is especially true if your red blood cells have gotten low (anemic). The transfusion raises the level of the red blood cells which carry oxygen, and this usually causes an energy increase.  The nurse administering the transfusion will monitor you carefully for complications. HOME CARE INSTRUCTIONS  No special instructions are needed after a transfusion. You may find your energy is better. Speak with your caregiver about any limitations on activity for underlying diseases you may have. SEEK MEDICAL CARE IF:   Your condition is not improving after your transfusion.  You develop redness or irritation at the intravenous (IV) site. SEEK IMMEDIATE MEDICAL CARE IF:  Any of the following symptoms occur over the next 12 hours:  Shaking chills.  You have a temperature by mouth above 102 F (38.9 C), not controlled by medicine.  Chest, back, or muscle pain.  People around you feel you are not acting correctly or are confused.  Shortness of breath or difficulty breathing.  Dizziness and fainting.  You get a rash or develop hives.  You have a decrease in urine output.  Your urine turns a dark color or changes to pink, red, or brown. Any of the following symptoms occur over the next 10 days:  You have a temperature by mouth above 102 F (38.9 C), not controlled by medicine.  Shortness of breath.  Weakness after normal activity.  The white part of the eye turns yellow (jaundice).  You have a decrease in the amount of urine or are urinating less often.  Your urine turns a dark color or changes to pink, red, or brown. Document Released: 07/06/2000 Document Revised: 10/01/2011 Document Reviewed: 02/23/2008 [facility] Patient Information 2014 [place],  [place].  _______________________________________________________________________

## 2019-10-20 ENCOUNTER — Encounter (HOSPITAL_COMMUNITY)
Admission: RE | Admit: 2019-10-20 | Discharge: 2019-10-20 | Disposition: A | Discharge status: Home / Self Care | Payer: BC Managed Care – PPO | Source: Ambulatory Visit | Attending: Orthopedic Surgery | Admitting: Orthopedic Surgery

## 2019-10-20 ENCOUNTER — Encounter (HOSPITAL_COMMUNITY): Admission: RE | Admit: 2019-10-20 | Payer: BC Managed Care – PPO | Source: Ambulatory Visit

## 2019-10-24 ENCOUNTER — Inpatient Hospital Stay (HOSPITAL_COMMUNITY): Admission: RE | Admit: 2019-10-24 | Payer: BC Managed Care – PPO | Source: Ambulatory Visit

## 2019-10-28 NOTE — H&P (Signed)
TOTAL HIP ADMISSION H&P  Patient is admitted for right total hip arthroplasty.  Subjective:  Chief Complaint: Right hip pain  HPI: Brooke Weeks, 44 y.o. female, has a history of pain and functional disability in the right hip due to arthritis and patient has failed non-surgical conservative treatments for greater than 12 weeks to include corticosteriod injections and activity modification. Onset of symptoms was gradual, starting several years ago with gradually worsening course since that time. The patient noted no past surgery on the right hip. Patient currently rates pain in the right hip at 8 out of 10 with activity. Patient has worsening of pain with activity and weight bearing, pain that interfers with activities of daily living and crepitus. Patient has evidence of bone-on-bone arthritis in the right hip by imaging studies. This condition presents safety issues increasing the risk of falls. There is no current active infection.  There are no problems to display for this patient.   Past Medical History:  Diagnosis Date  . Bronchitis   . Hypertension     Past Surgical History:  Procedure Laterality Date  . KNEE SURGERY     "titanium rod in left knee"    Prior to Admission medications   Medication Sig Start Date End Date Taking? Authorizing Provider  acetaminophen (TYLENOL) 325 MG tablet Take 2 tablets (650 mg total) by mouth every 6 (six) hours as needed. 12/02/17  Yes Nanavati, Janey Genta, MD  ascorbic acid (VITAMIN C) 500 MG tablet Take 500 mg by mouth daily.   Yes [provider]  cholecalciferol (VITAMIN D3) 25 MCG (1000 UNIT) tablet Take 1,000 Units by mouth daily.   Yes [provider]  ibuprofen (ADVIL) 200 MG tablet Take 400 mg by mouth every 6 (six) hours as needed for moderate pain.   Yes [provider]  meloxicam (MOBIC) 15 MG tablet Take 15 mg by mouth daily. 10/08/19  Yes [provider]  Multiple Vitamin (MULTIVITAMIN) tablet Take 1  tablet by mouth daily.   Yes [provider]  traMADol (ULTRAM) 50 MG tablet Take 50 mg by mouth every 6 (six) hours as needed for moderate pain.  01/16/17  Yes [provider]  valsartan-hydrochlorothiazide (DIOVAN-HCT) 160-25 MG per tablet Take 1 tablet by mouth daily.   Yes [provider]  albuterol (PROVENTIL HFA;VENTOLIN HFA) 108 (90 BASE) MCG/ACT inhaler Inhale 1-2 puffs into the lungs every 6 (six) hours as needed for wheezing. Patient not taking: Reported on 01/25/2017 08/05/12   Sunnie Nielsen, MD  ibuprofen (ADVIL,MOTRIN) 800 MG tablet Take 1 tablet (800 mg total) by mouth 3 (three) times daily. Patient not taking: Reported on 10/15/2019 08/05/12   Sunnie Nielsen, MD  methocarbamol (ROBAXIN) 500 MG tablet Take 1 tablet (500 mg total) by mouth 2 (two) times daily. Patient not taking: Reported on 10/15/2019 12/02/17   Derwood Kaplan, MD  oxyCODONE-acetaminophen (PERCOCET/ROXICET) 5-325 MG tablet Take 1 tablet by mouth every 4 (four) hours as needed for severe pain. Patient not taking: Reported on 10/15/2019 12/02/17   Derwood Kaplan, MD  pantoprazole (PROTONIX) 20 MG tablet Take 2 tablets (40 mg total) by mouth daily. Patient not taking: Reported on 10/15/2019 01/25/17   Orson Slick, MD  predniSONE (DELTASONE) 10 MG tablet Take 6 tablets (60 mg total) by mouth daily. Patient not taking: Reported on 10/15/2019 12/02/17   Derwood Kaplan, MD    Allergies  Allergen Reactions  . Latex Rash    Social History   Socioeconomic History  . Marital status:  Single    Spouse name: Not on file  . Number of children: Not on file  . Years of education: Not on file  . Highest education level: Not on file  Occupational History  . Not on file  Tobacco Use  . Smoking status: Never Smoker  . Smokeless tobacco: Never Used  Substance and Sexual Activity  . Alcohol use: Yes  . Drug use: No  . Sexual activity: Not on file  Other Topics Concern  . Not on file  Social History  Narrative  . Not on file   Social Determinants of Health   Financial Resource Strain:   . Difficulty of Paying Living Expenses:   Food Insecurity:   . Worried About Programme researcher, broadcasting/film/video in the Last Year:   . Barista in the Last Year:   Transportation Needs:   . Freight forwarder (Medical):   Marland Kitchen Lack of Transportation (Non-Medical):   Physical Activity:   . Days of Exercise per Week:   . Minutes of Exercise per Session:   Stress:   . Feeling of Stress :   Social Connections:   . Frequency of Communication with Friends and Family:   . Frequency of Social Gatherings with Friends and Family:   . Attends Religious Services:   . Active Member of Clubs or Organizations:   . Attends Banker Meetings:   Marland Kitchen Marital Status:   Intimate Partner Violence:   . Fear of Current or Ex-Partner:   . Emotionally Abused:   Marland Kitchen Physically Abused:   . Sexually Abused:       Tobacco Use:   . Smoking Tobacco Use:   . Smokeless Tobacco Use:    Social History   Substance and Sexual Activity  Alcohol Use Yes    No family history on file.  Review of Systems  Constitutional: Negative for chills and fever.  HENT: Negative for congestion, sore throat and tinnitus.   Eyes: Negative for double vision, photophobia and pain.  Respiratory: Negative for cough, shortness of breath and wheezing.   Cardiovascular: Negative for chest pain, palpitations and orthopnea.  Gastrointestinal: Negative for heartburn, nausea and vomiting.  Genitourinary: Negative for dysuria, frequency and urgency.  Musculoskeletal: Positive for joint pain.  Neurological: Negative for dizziness, weakness and headaches.    Objective:  Physical Exam: Well nourished and well developed.  General: Alert and oriented x3, cooperative and pleasant, no acute distress.  Head: normocephalic, atraumatic, neck supple.  Eyes: EOMI.  Respiratory: breath sounds clear in all fields, no wheezing, rales, or  rhonchi. Cardiovascular: Regular rate and rhythm, no murmurs, gallops or rubs.  Abdomen: non-tender to palpation and soft, normoactive bowel sounds. Musculoskeletal:  Right Hip Exam: ROM: Flexion to 95, Internal Rotation 0, External Rotation 20, and abduction 20 without discomfort. There is no tenderness over the greater trochanter bursa.  Calves soft and nontender. Motor function intact in LE. Strength 5/5 LE bilaterally. Neuro: Distal pulses 2+. Sensation to light touch intact in LE.  Vital signs in last 24 hours: Blood pressure: 130/78 mmHg  Imaging Review Plain radiographs demonstrate severe degenerative joint disease of the right hip. The bone quality appears to be adequate for age and reported activity level.  Assessment/Plan:  End stage arthritis, right hip  The patient history, physical examination, clinical judgement of the provider and imaging studies are consistent with end stage degenerative joint disease of the right hip and total hip arthroplasty is deemed medically necessary. The treatment options  including medical management, injection therapy, arthroscopy and arthroplasty were discussed at length. The risks and benefits of total hip arthroplasty were presented and reviewed. The risks due to aseptic loosening, infection, stiffness, dislocation/subluxation, thromboembolic complications and other imponderables were discussed. The patient acknowledged the explanation, agreed to proceed with the plan and consent was signed. Patient is being admitted for inpatient treatment for surgery, pain control, PT, OT, prophylactic antibiotics, VTE prophylaxis, progressive ambulation and ADLs and discharge planning.The patient is planning to be discharged home.   Patient's anticipated LOS is less than 2 midnights, meeting these requirements: - Younger than 77 - Lives within 1 hour of care - Has a competent adult at home to recover with post-op recover - NO history of  - Chronic pain  requiring opiods  - Diabetes  - Coronary Artery Disease  - Heart failure  - Heart attack  - Stroke  - DVT/VTE  - Cardiac arrhythmia  - Respiratory Failure/COPD  - Renal failure  - Anemia  - Advanced Liver disease  Therapy Plans: HEP Disposition: Home with wife Planned DVT Prophylaxis: Aspirin 325 mg BID DME Needed: None PCP: Dr. Delfina Redwood TXA: IV Allergies: NKDA Anesthesia Concerns: Sleep apnea BMI: 40.5  Other: Patient's BMI is 40.5 today. She needs to lose 4 pounds prior to surgery in order to be a safe surgical candidate. This was discussed at length with the patient and she is going to diligently work towards this goal in order to be under BMI 40 by 5/5.  - Patient was instructed on what medications to stop prior to surgery. - Follow-up visit in 2 weeks with Dr. Wynelle Link - Begin physical therapy following surgery - Pre-operative lab work as pre-surgical testing - Prescriptions will be provided in hospital at time of discharge  Theresa Duty, PA-C Orthopedic Surgery [facility] [place]

## 2019-11-16 NOTE — Patient Instructions (Signed)
DUE TO COVID-19 ONLY ONE VISITOR IS ALLOWED TO COME WITH YOU AND STAY IN THE WAITING ROOM ONLY DURING PRE OP AND PROCEDURE DAY OF SURGERY. THE 2 VISITORS MAY VISIT WITH YOU AFTER SURGERY IN YOUR PRIVATE ROOM DURING VISITING HOURS ONLY!  YOU NEED TO HAVE A COVID 19 TEST ON_Sat 5/1______ @_10 :15______, THIS TEST MUST BE DONE BEFORE SURGERY, COME  [address], [place] , .  [facility]) ONCE YOUR COVID TEST IS COMPLETED, PLEASE BEGIN THE QUARANTINE INSTRUCTIONS AS OUTLINED IN YOUR HANDOUT.                [facility]    Your procedure is scheduled on:11/25/19   Report to [facility] Main  Entrance   Report to admitting at  11:40 AM     Call this number if you have problems the morning of surgery [phone]    BRUSH YOUR TEETH MORNING OF SURGERY AND RINSE YOUR MOUTH OUT, NO CHEWING GUM CANDY OR MINTS.   Do not eat food After Midnight.   YOU MAY HAVE CLEAR LIQUIDS FROM MIDNIGHT UNTIL 11:00 AM.     CLEAR LIQUID DIET   Foods Allowed                                                                     Foods Excluded  Coffee and tea, regular and decaf                             liquids that you cannot  Plain Jell-O any favor except red or purple                                           see through such as: Fruit ices (not with fruit pulp)                                     milk, soups, orange juice  Iced Popsicles                                    All solid food Carbonated beverages, regular and diet                                    Cranberry, grape and apple juices Sports drinks like Gatorade Lightly seasoned clear broth or consume(fat free) Sugar, honey syrup ____________________________________________________________________     At 11:00 AM Please finish the prescribed Pre-Surgery  Drink.   Nothing by mouth after you finish the  drink !    Take these medicines the morning of surgery with A SIP OF WATER: Use your inhalers and  bring them with you to the hospital  You may not have any metal on your body including hair pins and              piercings  Do not wear jewelry, make-up, lotions, powders or perfumes, deodorant             Do not wear nail polish on your fingernails.  Do not shave  48 hours prior to surgery.               Do not bring valuables to the hospital. [facility].  Contacts, dentures or bridgework may not be worn into surgery.      Patients discharged the day of surgery will not be allowed to drive home.   IF YOU ARE HAVING SURGERY AND GOING HOME THE SAME DAY, YOU MUST HAVE AN ADULT TO DRIVE YOU HOME AND BE WITH YOU FOR 24 HOURS.   YOU MAY GO HOME BY TAXI OR UBER OR ORTHERWISE, BUT AN ADULT MUST ACCOMPANY YOU HOME AND STAY WITH YOU FOR 24 HOURS.  Name and phone number of your driver:  Special Instructions: N/A              Please read over the following fact sheets you were given: _____________________________________________________________________             [facility] - Preparing for Surgery Before surgery, you can play an important role.   Because skin is not sterile, your skin needs to be as free of germs as possible.   You can reduce the number of germs on your skin by washing with CHG (chlorahexidine gluconate) soap before surgery.   CHG is an antiseptic cleaner which kills germs and bonds with the skin to continue killing germs even after washing. Please DO NOT use if you have an allergy to CHG or antibacterial soaps .  If your skin becomes reddened/irritated stop using the CHG and inform your nurse when you arrive at Short Stay. Do not shave (including legs and underarms) for at least 48 hours prior to the first CHG shower.    Please follow these instructions carefully:  1.  Shower with CHG Soap the night before surgery and the  morning of Surgery.  2.  If you choose to wash your hair, wash your  hair first as usual with your  normal  shampoo.  3.  After you shampoo, rinse your hair and body thoroughly to remove the  shampoo.                                        4.  Use CHG as you would any other liquid soap.  You can apply chg directly  to the skin and wash                       Gently with a scrungie or clean washcloth.  5.  Apply the CHG Soap to your body ONLY FROM THE NECK DOWN.   Do not use on face/ open                           Wound or open sores. Avoid contact with eyes, ears mouth and genitals (private parts).  Wash face,  Genitals (private parts) with your normal soap.             6.  Wash thoroughly, paying special attention to the area where your surgery  will be performed.  7.  Thoroughly rinse your body with warm water from the neck down.  8.  DO NOT shower/wash with your normal soap after using and rinsing off  the CHG Soap.             9.  Pat yourself dry with a clean towel.            10.  Wear clean pajamas.            11.  Place clean sheets on your bed the night of your first shower and do not  sleep with pets. Day of Surgery : Do not apply any lotions/deodorants the morning of surgery.  Please wear clean clothes to the hospital/surgery center.  FAILURE TO FOLLOW THESE INSTRUCTIONS MAY RESULT IN THE CANCELLATION OF YOUR SURGERY PATIENT SIGNATURE_________________________________  NURSE SIGNATURE__________________________________  __  Incentive Spirometer  An incentive spirometer is a tool that can help keep your lungs clear and active. This tool measures how well you are filling your lungs with each breath. Taking long deep breaths may help reverse or decrease the chance of developing breathing (pulmonary) problems (especially infection) following:  A long period of time when you are unable to move or be active. BEFORE THE PROCEDURE   If the spirometer includes an indicator to show your best effort, your nurse or respiratory therapist  will set it to a desired goal.  If possible, sit up straight or lean slightly forward. Try not to slouch.  Hold the incentive spirometer in an upright position. INSTRUCTIONS FOR USE  1. Sit on the edge of your bed if possible, or sit up as far as you can in bed or on a chair. 2. Hold the incentive spirometer in an upright position. 3. Breathe out normally. 4. Place the mouthpiece in your mouth and seal your lips tightly around it. 5. Breathe in slowly and as deeply as possible, raising the piston or the ball toward the top of the column. 6. Hold your breath for 3-5 seconds or for as long as possible. Allow the piston or ball to fall to the bottom of the column. 7. Remove the mouthpiece from your mouth and breathe out normally. 8. Rest for a few seconds and repeat Steps 1 through 7 at least 10 times every 1-2 hours when you are awake. Take your time and take a few normal breaths between deep breaths. 9. The spirometer may include an indicator to show your best effort. Use the indicator as a goal to work toward during each repetition. 10. After each set of 10 deep breaths, practice coughing to be sure your lungs are clear. If you have an incision (the cut made at the time of surgery), support your incision when coughing by placing a pillow or rolled up towels firmly against it. Once you are able to get out of bed, walk around indoors and cough well. You may stop using the incentive spirometer when instructed by your caregiver.  RISKS AND COMPLICATIONS  Take your time so you do not get dizzy or light-headed.  If you are in pain, you may need to take or ask for pain medication before doing incentive spirometry. It is harder to take a deep breath if you are having pain. AFTER USE  Rest  and breathe slowly and easily.  It can be helpful to keep track of a log of your progress. Your caregiver can provide you with a simple table to help with this. If you are using the spirometer at home, follow  these instructions: SEEK MEDICAL CARE IF:   You are having difficultly using the spirometer.  You have trouble using the spirometer as often as instructed.  Your pain medication is not giving enough relief while using the spirometer.  You develop fever of 100.5 F (38.1 C) or higher. SEEK IMMEDIATE MEDICAL CARE IF:   You cough up bloody sputum that had not been present before.  You develop fever of 102 F (38.9 C) or greater.  You develop worsening pain at or near the incision site. MAKE SURE YOU:   Understand these instructions.  Will watch your condition.  Will get help right away if you are not doing well or get worse. Document Released: 11/19/2006 Document Revised: 10/01/2011 Document Reviewed: 01/20/2007 [facility] Patient Information 2014 [employer], [place].   ________________________________________________________________________ ______________________________________________________________________

## 2019-11-18 ENCOUNTER — Encounter (HOSPITAL_COMMUNITY)
Admission: RE | Admit: 2019-11-18 | Discharge: 2019-11-18 | Disposition: A | Discharge status: Home / Self Care | Payer: BC Managed Care – PPO | Source: Ambulatory Visit | Attending: Orthopedic Surgery | Admitting: Orthopedic Surgery

## 2019-11-18 NOTE — Progress Notes (Signed)
I attempted to Call Pt for her PAT visit x 2.I left a message then called Aluisio's office.

## 2019-11-19 ENCOUNTER — Encounter (HOSPITAL_COMMUNITY): Admission: RE | Admit: 2019-11-19 | Payer: BC Managed Care – PPO | Source: Ambulatory Visit

## 2019-11-20 ENCOUNTER — Encounter (HOSPITAL_COMMUNITY): Payer: BC Managed Care – PPO

## 2019-11-20 ENCOUNTER — Encounter (HOSPITAL_COMMUNITY)
Admission: RE | Admit: 2019-11-20 | Discharge: 2019-11-20 | Disposition: A | Discharge status: Home / Self Care | Payer: BC Managed Care – PPO | Source: Ambulatory Visit | Attending: Orthopedic Surgery | Admitting: Orthopedic Surgery

## 2019-11-21 ENCOUNTER — Other Ambulatory Visit (HOSPITAL_COMMUNITY): Payer: BC Managed Care – PPO

## 2019-11-25 ENCOUNTER — Encounter (HOSPITAL_COMMUNITY): Admission: RE | Payer: Self-pay | Source: Home / Self Care

## 2019-11-25 ENCOUNTER — Ambulatory Visit (HOSPITAL_COMMUNITY)
Admission: RE | Admit: 2019-11-25 | Payer: BC Managed Care – PPO | Source: Home / Self Care | Admitting: Orthopedic Surgery

## 2019-11-25 SURGERY — ARTHROPLASTY, HIP, TOTAL, ANTERIOR APPROACH
Anesthesia: Choice | Site: Hip | Laterality: Right

## 2020-03-11 NOTE — Patient Instructions (Addendum)
DUE TO COVID-19 ONLY ONE VISITOR IS ALLOWED TO COME WITH YOU AND STAY IN THE WAITING ROOM ONLY DURING PRE OP AND PROCEDURE.   IF YOU WILL BE ADMITTED INTO THE HOSPITAL YOU ARE ALLOWED ONE SUPPORT PERSON DURING VISITATION HOURS ONLY (10AM -8PM)   . The support person may change daily. . The support person must pass our screening, gel in and out, and wear a mask at all times, including in the patient's room. . Patients must also wear a mask when staff or their support person are in the room.   COVID SWAB TESTING MUST BE COMPLETED ON:  Saturday, Aug. 28, 2021 at 9:25 AM   [address] [place], [place] [address]  (Must self quarantine after testing. Follow instructions on handout.)       Your procedure is scheduled on: Wednesday, Sept 1, 2021   Report to [facility] Main  Entrance   Report to Short Stay at 6:00AM  [facility])   Call this number if you have problems the morning of surgery [phone]   Do not eat food :After Midnight.   May have liquids until    day of surgery  CLEAR LIQUID DIET  Foods Allowed                                                                     Foods Excluded  Water, Black Coffee and tea, regular and decaf                             liquids that you cannot  Plain Jell-O in any flavor  (No red)                                           see through such as: Fruit ices (not with fruit pulp)                                     milk, soups, orange juice              Iced Popsicles (No red)                                    All solid food                                   Apple juices Sports drinks like Gatorade (No red) Lightly seasoned clear broth or consume(fat free) Sugar, honey syrup      Complete one Ensure drink the morning of surgery at  5:30 AM     the day of surgery.   Oral Hygiene is also important to reduce your risk of infection.  Remember - BRUSH YOUR TEETH THE MORNING OF SURGERY WITH  YOUR REGULAR TOOTHPASTE   Do NOT smoke after Midnight   Take these medicines the morning of surgery with A SIP OF WATER: None    Bring CPAP mask and tubing day of surgery                               You may not have any metal on your body including hair pins, jewelry, and body piercings             Do not wear make-up, lotions, powders, perfumes/cologne, or deodorant             Do not wear nail polish.  Do not shave  48 hours prior to surgery.              Do not bring valuables to the hospital. [name] IS NOT             RESPONSIBLE   FOR VALUABLES.   Contacts, dentures or bridgework may not be worn into surgery.   Bring small overnight bag day of surgery.    Special Instructions: Bring a copy of your healthcare power of attorney and living will documents the day of surgery if you haven't scanned them in before.              Please read over the following fact sheets you were given: IF YOU HAVE QUESTIONS ABOUT YOUR PRE OP INSTRUCTIONS PLEASE CALL [phone]   [place] - Preparing for Surgery Before surgery, you can play an important role.  Because skin is not sterile, your skin needs to be as free of germs as possible.  You can reduce the number of germs on your skin by washing with CHG (chlorahexidine gluconate) soap before surgery.  CHG is an antiseptic cleaner which kills germs and bonds with the skin to continue killing germs even after washing. Please DO NOT use if you have an allergy to CHG or antibacterial soaps.  If your skin becomes reddened/irritated stop using the CHG and inform your nurse when you arrive at Short Stay. Do not shave (including legs and underarms) for at least 48 hours prior to the first CHG shower.  You may shave your face/neck.  Please follow these instructions carefully:  1.  Shower with CHG Soap the night before surgery and the  morning of surgery.  2.  If you choose to wash your hair, wash your hair first as usual with your normal   shampoo.  3.  After you shampoo, rinse your hair and body thoroughly to remove the shampoo.                             4.  Use CHG as you would any other liquid soap.  You can apply chg directly to the skin and wash.  Gently with a scrungie or clean washcloth.  5.  Apply the CHG Soap to your body ONLY FROM THE NECK DOWN.   Do   not use on face/ open                           Wound or open sores. Avoid contact with eyes, ears mouth and   genitals (private parts).  Wash face,  Genitals (private parts) with your normal soap.             6.  Wash thoroughly, paying special attention to the area where your    surgery  will be performed.  7.  Thoroughly rinse your body with warm water from the neck down.  8.  DO NOT shower/wash with your normal soap after using and rinsing off the CHG Soap.                9.  Pat yourself dry with a clean towel.            10.  Wear clean pajamas.            11.  Place clean sheets on your bed the night of your first shower and do not  sleep with pets. Day of Surgery : Do not apply any lotions/deodorants the morning of surgery.  Please wear clean clothes to the hospital/surgery center.  FAILURE TO FOLLOW THESE INSTRUCTIONS MAY RESULT IN THE CANCELLATION OF YOUR SURGERY  PATIENT SIGNATURE_________________________________  NURSE SIGNATURE__________________________________  ________________________________________________________________________   Brooke Weeks  An incentive spirometer is a tool that can help keep your lungs clear and active. This tool measures how well you are filling your lungs with each breath. Taking long deep breaths may help reverse or decrease the chance of developing breathing (pulmonary) problems (especially infection) following:  A long period of time when you are unable to move or be active. BEFORE THE PROCEDURE   If the spirometer includes an indicator to show your best effort, your nurse or respiratory  therapist will set it to a desired goal.  If possible, sit up straight or lean slightly forward. Try not to slouch.  Hold the incentive spirometer in an upright position. INSTRUCTIONS FOR USE  1. Sit on the edge of your bed if possible, or sit up as far as you can in bed or on a chair. 2. Hold the incentive spirometer in an upright position. 3. Breathe out normally. 4. Place the mouthpiece in your mouth and seal your lips tightly around it. 5. Breathe in slowly and as deeply as possible, raising the piston or the ball toward the top of the column. 6. Hold your breath for 3-5 seconds or for as long as possible. Allow the piston or ball to fall to the bottom of the column. 7. Remove the mouthpiece from your mouth and breathe out normally. 8. Rest for a few seconds and repeat Steps 1 through 7 at least 10 times every 1-2 hours when you are awake. Take your time and take a few normal breaths between deep breaths. 9. The spirometer may include an indicator to show your best effort. Use the indicator as a goal to work toward during each repetition. 10. After each set of 10 deep breaths, practice coughing to be sure your lungs are clear. If you have an incision (the cut made at the time of surgery), support your incision when coughing by placing a pillow or rolled up towels firmly against it. Once you are able to get out of bed, walk around indoors and cough well. You may stop using the incentive spirometer when instructed by your caregiver.  RISKS AND COMPLICATIONS  Take your time so you do not get dizzy or light-headed.  If you are in pain, you may need to take or ask for pain medication before doing incentive spirometry. It is harder to take a deep breath if you  are having pain. AFTER USE  Rest and breathe slowly and easily.  It can be helpful to keep track of a log of your progress. Your caregiver can provide you with a simple table to help with this. If you are using the spirometer at home,  follow these instructions: [place] IF:   You are having difficultly using the spirometer.  You have trouble using the spirometer as often as instructed.  Your pain medication is not giving enough relief while using the spirometer.  You develop fever of 100.5 F (38.1 C) or higher. SEEK IMMEDIATE MEDICAL CARE IF:   You cough up bloody sputum that had not been present before.  You develop fever of 102 F (38.9 C) or greater.  You develop worsening pain at or near the incision site. MAKE SURE YOU:   Understand these instructions.  Will watch your condition.  Will get help right away if you are not doing well or get worse. Document Released: 11/19/2006 Document Revised: 10/01/2011 Document Reviewed: 01/20/2007 Exit[employer] Patient Information 2014 [employer], [place].   ________________________________________________________________________  WHAT IS A BLOOD TRANSFUSION? Blood Transfusion Information  A transfusion is the replacement of blood or some of its parts. Blood is made up of multiple cells which provide different functions.  Red blood cells carry oxygen and are used for blood loss replacement.  White blood cells fight against infection.  Platelets control bleeding.  Plasma helps clot blood.  Other blood products are available for specialized needs, such as hemophilia or other clotting disorders. BEFORE THE TRANSFUSION  Who gives blood for transfusions?   Healthy volunteers who are fully evaluated to make sure their blood is safe. This is blood bank blood. Transfusion therapy is the safest it has ever been in the practice of medicine. Before blood is taken from a donor, a complete history is taken to make sure that person has no history of diseases nor engages in risky social behavior (examples are intravenous drug use or sexual activity with multiple partners). The donor's travel history is screened to minimize risk of transmitting infections, such as malaria.  The donated blood is tested for signs of infectious diseases, such as HIV and hepatitis. The blood is then tested to be sure it is compatible with you in order to minimize the chance of a transfusion reaction. If you or a relative donates blood, this is often done in anticipation of surgery and is not appropriate for emergency situations. It takes many days to process the donated blood. RISKS AND COMPLICATIONS Although transfusion therapy is very safe and saves many lives, the main dangers of transfusion include:   Getting an infectious disease.  Developing a transfusion reaction. This is an allergic reaction to something in the blood you were given. Every precaution is taken to prevent this. The decision to have a blood transfusion has been considered carefully by your caregiver before blood is given. Blood is not given unless the benefits outweigh the risks. AFTER THE TRANSFUSION  Right after receiving a blood transfusion, you will usually feel much better and more energetic. This is especially true if your red blood cells have gotten low (anemic). The transfusion raises the level of the red blood cells which carry oxygen, and this usually causes an energy increase.  The nurse administering the transfusion will monitor you carefully for complications. HOME CARE INSTRUCTIONS  No special instructions are needed after a transfusion. You may find your energy is better. Speak with your caregiver about any limitations on activity  for underlying diseases you may have. SEEK MEDICAL CARE IF:   Your condition is not improving after your transfusion.  You develop redness or irritation at the intravenous (IV) site. SEEK IMMEDIATE MEDICAL CARE IF:  Any of the following symptoms occur over the next 12 hours:  Shaking chills.  You have a temperature by mouth above 102 F (38.9 C), not controlled by medicine.  Chest, back, or muscle pain.  People around you feel you are not acting correctly or are  confused.  Shortness of breath or difficulty breathing.  Dizziness and fainting.  You get a rash or develop hives.  You have a decrease in urine output.  Your urine turns a dark color or changes to pink, red, or brown. Any of the following symptoms occur over the next 10 days:  You have a temperature by mouth above 102 F (38.9 C), not controlled by medicine.  Shortness of breath.  Weakness after normal activity.  The white part of the eye turns yellow (jaundice).  You have a decrease in the amount of urine or are urinating less often.  Your urine turns a dark color or changes to pink, red, or brown. Document Released: 07/06/2000 Document Revised: 10/01/2011 Document Reviewed: 02/23/2008 [facility] Patient Information 2014 [employer], [place].  _______________________________________________________________________

## 2020-03-11 NOTE — Progress Notes (Signed)
COVID Vaccine Completed: Date COVID Vaccine completed: COVID vaccine manufacturer: Pfizer    Moderna   [employer]   PCP - Dr. Darrol Jump Cardiologist -   Chest x-ray - greater than 1 year EKG - 03/14/2020 in epic (pre op) Stress Test -  ECHO -  Cardiac Cath -   Sleep Study -  CPAP -   Fasting Blood Sugar -  Checks Blood Sugar _____ times a day  Blood Thinner Instructions: Aspirin Instructions: Last Dose:  Anesthesia review:   Patient denies shortness of breath, fever, cough and chest pain at PAT appointment   Patient verbalized understanding of instructions that were given to them at the PAT appointment. Patient was also instructed that they will need to review over the PAT instructions again at home before surgery.

## 2020-03-14 ENCOUNTER — Encounter (HOSPITAL_COMMUNITY): Payer: Self-pay

## 2020-03-14 ENCOUNTER — Other Ambulatory Visit: Payer: Self-pay

## 2020-03-14 ENCOUNTER — Encounter (HOSPITAL_COMMUNITY)
Admission: RE | Admit: 2020-03-14 | Discharge: 2020-03-14 | Disposition: A | Discharge status: Home / Self Care | Payer: BC Managed Care – PPO | Source: Ambulatory Visit | Attending: Orthopedic Surgery | Admitting: Orthopedic Surgery

## 2020-03-14 DIAGNOSIS — Z01818 Encounter for other preprocedural examination: Secondary | ICD-10-CM | POA: Diagnosis present

## 2020-03-14 HISTORY — DX: Unspecified osteoarthritis, unspecified site: M19.90

## 2020-03-14 HISTORY — DX: Sleep apnea, unspecified: G47.30

## 2020-03-14 HISTORY — DX: Other complications of anesthesia, initial encounter: T88.59XA

## 2020-03-14 LAB — COMPREHENSIVE METABOLIC PANEL
ALT: 19 U/L (ref 0–44)
AST: 20 U/L (ref 15–41)
Albumin: 4.2 g/dL (ref 3.5–5.0)
Alkaline Phosphatase: 86 U/L (ref 38–126)
Anion gap: 11 (ref 5–15)
BUN: 18 mg/dL (ref 6–20)
CO2: 27 mmol/L (ref 22–32)
Calcium: 9.8 mg/dL (ref 8.9–10.3)
Chloride: 101 mmol/L (ref 98–111)
Creatinine, Ser: 0.78 mg/dL (ref 0.44–1.00)
GFR calc Af Amer: >60 mL/min (ref >60)
GFR calc non Af Amer: >60 mL/min (ref >60)
Glucose, Bld: 86 mg/dL (ref 70–99)
Potassium: 3.6 mmol/L (ref 3.5–5.1)
Sodium: 139 mmol/L (ref 135–145)
Total Bilirubin: 0.3 mg/dL (ref 0.3–1.2)
Total Protein: 7.8 g/dL (ref 6.5–8.1)

## 2020-03-14 LAB — CBC
HCT: 38.6 % (ref 36.0–46.0)
Hemoglobin: 12.8 g/dL (ref 12.0–15.0)
MCH: 29.4 pg (ref 26.0–34.0)
MCHC: 33.2 g/dL (ref 30.0–36.0)
MCV: 88.7 fL (ref 80.0–100.0)
Platelets: 325 10*3/uL (ref 150–400)
RBC: 4.35 MIL/uL (ref 3.87–5.11)
RDW: 13.4 % (ref 11.5–15.5)
WBC: 8.3 10*3/uL (ref 4.0–10.5)
nRBC: 0 % (ref 0.0–0.2)

## 2020-03-14 LAB — APTT: aPTT: 32 seconds (ref 24–36)

## 2020-03-14 LAB — PROTIME-INR
INR: 1 (ref 0.8–1.2)
Prothrombin Time: 12.7 seconds (ref 11.4–15.2)

## 2020-03-14 LAB — SURGICAL PCR SCREEN
MRSA, PCR: NEGATIVE
Staphylococcus aureus: POSITIVE — AB

## 2020-03-14 NOTE — Progress Notes (Signed)
COVID Vaccine Completed: Date COVID Vaccine completed: COVID vaccine manufacturer: Pfizer    Moderna   [employer]   PCP - Dr. R. Polite Cardiologist -   Chest x-ray - greater than 1 year EKG - 03/14/2020 in epic (pre op) Stress Test -  ECHO -  Cardiac Cath -   Sleep Study -  CPAP -   Fasting Blood Sugar -  Checks Blood Sugar _____ times a day  Blood Thinner Instructions: Aspirin Instructions: Last Dose:  Anesthesia review:   Patient denies shortness of breath, fever, cough and chest pain at PAT appointment   Patient verbalized understanding of instructions that were given to them at the PAT appointment. Patient was also instructed that they will need to review over the PAT instructions again at home before surgery. 

## 2020-03-19 ENCOUNTER — Other Ambulatory Visit (HOSPITAL_COMMUNITY): Payer: BC Managed Care – PPO

## 2020-03-23 ENCOUNTER — Ambulatory Visit (HOSPITAL_COMMUNITY)
Admission: RE | Admit: 2020-03-23 | Payer: BC Managed Care – PPO | Source: Home / Self Care | Admitting: Orthopedic Surgery

## 2020-03-23 LAB — TYPE AND SCREEN
ABO/RH(D): O POS
Antibody Screen: NEGATIVE

## 2020-03-23 SURGERY — ARTHROPLASTY, HIP, TOTAL, ANTERIOR APPROACH
Anesthesia: Choice | Site: Hip | Laterality: Right

## 2020-08-11 ENCOUNTER — Ambulatory Visit (INDEPENDENT_AMBULATORY_CARE_PROVIDER_SITE_OTHER): Payer: BC Managed Care – PPO | Admitting: Orthotics

## 2020-08-11 ENCOUNTER — Other Ambulatory Visit: Payer: Self-pay

## 2020-08-11 DIAGNOSIS — M722 Plantar fascial fibromatosis: Secondary | ICD-10-CM | POA: Diagnosis not present

## 2020-08-11 NOTE — Progress Notes (Signed)
Patient came into today for casting bilateral f/o to address plantar fasciitis.  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  Goal is to provide longitudinal arch support and correct any RF instability due to heel eversion/inversion.  Ultimate goal is to relieve tension at pf insertion calcaneal tuberosity.  Plan on semi-rigid device addressing heel stability and relieving PF tension.     Also deep heel cup and 4* varus wedge to relieve lateral pressure that is causing callusing RIGHT.

## 2020-08-24 ENCOUNTER — Ambulatory Visit: Payer: BC Managed Care – PPO | Admitting: Podiatry

## 2020-09-08 ENCOUNTER — Ambulatory Visit: Payer: BC Managed Care – PPO | Admitting: Orthotics

## 2020-09-08 ENCOUNTER — Other Ambulatory Visit: Payer: Self-pay

## 2020-09-08 DIAGNOSIS — M722 Plantar fascial fibromatosis: Secondary | ICD-10-CM

## 2020-09-08 DIAGNOSIS — M7661 Achilles tendinitis, right leg: Secondary | ICD-10-CM

## 2020-09-08 NOTE — Progress Notes (Signed)
Patient picked up f/o and was pleased with fit, comfort, and function.  Worked well with footwear.  Told of rbeak in period and how to report any issues.  

## 2022-01-17 DIAGNOSIS — J069 Acute upper respiratory infection, unspecified: Secondary | ICD-10-CM | POA: Diagnosis not present

## 2022-04-27 DIAGNOSIS — J209 Acute bronchitis, unspecified: Secondary | ICD-10-CM | POA: Diagnosis not present

## 2022-04-27 DIAGNOSIS — R059 Cough, unspecified: Secondary | ICD-10-CM | POA: Diagnosis not present

## 2022-04-27 DIAGNOSIS — I10 Essential (primary) hypertension: Secondary | ICD-10-CM | POA: Diagnosis not present

## 2022-04-27 DIAGNOSIS — R519 Headache, unspecified: Secondary | ICD-10-CM | POA: Diagnosis not present

## 2022-07-14 DIAGNOSIS — G4733 Obstructive sleep apnea (adult) (pediatric): Secondary | ICD-10-CM | POA: Diagnosis not present

## 2022-07-14 DIAGNOSIS — G479 Sleep disorder, unspecified: Secondary | ICD-10-CM | POA: Diagnosis not present

## 2022-07-25 DIAGNOSIS — D485 Neoplasm of uncertain behavior of skin: Secondary | ICD-10-CM | POA: Diagnosis not present

## 2022-07-25 DIAGNOSIS — D3611 Benign neoplasm of peripheral nerves and autonomic nervous system of face, head, and neck: Secondary | ICD-10-CM | POA: Diagnosis not present

## 2022-07-25 DIAGNOSIS — D239 Other benign neoplasm of skin, unspecified: Secondary | ICD-10-CM | POA: Diagnosis not present

## 2022-08-14 DIAGNOSIS — G4733 Obstructive sleep apnea (adult) (pediatric): Secondary | ICD-10-CM | POA: Diagnosis not present

## 2022-08-17 DIAGNOSIS — I10 Essential (primary) hypertension: Secondary | ICD-10-CM | POA: Diagnosis not present

## 2022-08-17 DIAGNOSIS — E7800 Pure hypercholesterolemia, unspecified: Secondary | ICD-10-CM | POA: Diagnosis not present

## 2022-08-17 DIAGNOSIS — R739 Hyperglycemia, unspecified: Secondary | ICD-10-CM | POA: Diagnosis not present

## 2022-08-17 DIAGNOSIS — G4733 Obstructive sleep apnea (adult) (pediatric): Secondary | ICD-10-CM | POA: Diagnosis not present

## 2022-09-14 DIAGNOSIS — G479 Sleep disorder, unspecified: Secondary | ICD-10-CM | POA: Diagnosis not present

## 2022-09-14 DIAGNOSIS — G4733 Obstructive sleep apnea (adult) (pediatric): Secondary | ICD-10-CM | POA: Diagnosis not present

## 2022-12-04 DIAGNOSIS — G479 Sleep disorder, unspecified: Secondary | ICD-10-CM | POA: Diagnosis not present

## 2022-12-04 DIAGNOSIS — G4733 Obstructive sleep apnea (adult) (pediatric): Secondary | ICD-10-CM | POA: Diagnosis not present

## 2023-01-04 DIAGNOSIS — G479 Sleep disorder, unspecified: Secondary | ICD-10-CM | POA: Diagnosis not present

## 2023-01-04 DIAGNOSIS — G4733 Obstructive sleep apnea (adult) (pediatric): Secondary | ICD-10-CM | POA: Diagnosis not present

## 2023-02-01 DIAGNOSIS — G4733 Obstructive sleep apnea (adult) (pediatric): Secondary | ICD-10-CM | POA: Diagnosis not present

## 2023-02-01 DIAGNOSIS — G479 Sleep disorder, unspecified: Secondary | ICD-10-CM | POA: Diagnosis not present

## 2023-02-03 DIAGNOSIS — G4733 Obstructive sleep apnea (adult) (pediatric): Secondary | ICD-10-CM | POA: Diagnosis not present

## 2023-02-03 DIAGNOSIS — G479 Sleep disorder, unspecified: Secondary | ICD-10-CM | POA: Diagnosis not present

## 2023-03-04 DIAGNOSIS — G4733 Obstructive sleep apnea (adult) (pediatric): Secondary | ICD-10-CM | POA: Diagnosis not present

## 2023-03-04 DIAGNOSIS — G479 Sleep disorder, unspecified: Secondary | ICD-10-CM | POA: Diagnosis not present

## 2023-04-04 DIAGNOSIS — G479 Sleep disorder, unspecified: Secondary | ICD-10-CM | POA: Diagnosis not present

## 2023-04-04 DIAGNOSIS — G4733 Obstructive sleep apnea (adult) (pediatric): Secondary | ICD-10-CM | POA: Diagnosis not present

## 2023-05-04 DIAGNOSIS — G479 Sleep disorder, unspecified: Secondary | ICD-10-CM | POA: Diagnosis not present

## 2023-05-04 DIAGNOSIS — G4733 Obstructive sleep apnea (adult) (pediatric): Secondary | ICD-10-CM | POA: Diagnosis not present

## 2023-06-14 DIAGNOSIS — G4733 Obstructive sleep apnea (adult) (pediatric): Secondary | ICD-10-CM | POA: Diagnosis not present

## 2023-06-14 DIAGNOSIS — G479 Sleep disorder, unspecified: Secondary | ICD-10-CM | POA: Diagnosis not present

## 2023-07-14 DIAGNOSIS — G4733 Obstructive sleep apnea (adult) (pediatric): Secondary | ICD-10-CM | POA: Diagnosis not present

## 2023-07-14 DIAGNOSIS — G479 Sleep disorder, unspecified: Secondary | ICD-10-CM | POA: Diagnosis not present

## 2023-08-14 DIAGNOSIS — G4733 Obstructive sleep apnea (adult) (pediatric): Secondary | ICD-10-CM | POA: Diagnosis not present

## 2023-08-14 DIAGNOSIS — G479 Sleep disorder, unspecified: Secondary | ICD-10-CM | POA: Diagnosis not present

## 2023-09-18 DIAGNOSIS — E7800 Pure hypercholesterolemia, unspecified: Secondary | ICD-10-CM | POA: Diagnosis not present

## 2023-09-18 DIAGNOSIS — R7303 Prediabetes: Secondary | ICD-10-CM | POA: Diagnosis not present

## 2023-09-18 DIAGNOSIS — G4733 Obstructive sleep apnea (adult) (pediatric): Secondary | ICD-10-CM | POA: Diagnosis not present

## 2023-09-18 DIAGNOSIS — I10 Essential (primary) hypertension: Secondary | ICD-10-CM | POA: Diagnosis not present

## 2023-09-18 DIAGNOSIS — Z23 Encounter for immunization: Secondary | ICD-10-CM | POA: Diagnosis not present

## 2023-10-04 DIAGNOSIS — G479 Sleep disorder, unspecified: Secondary | ICD-10-CM | POA: Diagnosis not present

## 2023-10-04 DIAGNOSIS — G4733 Obstructive sleep apnea (adult) (pediatric): Secondary | ICD-10-CM | POA: Diagnosis not present

## 2023-10-18 DIAGNOSIS — H539 Unspecified visual disturbance: Secondary | ICD-10-CM | POA: Diagnosis not present

## 2023-10-28 DIAGNOSIS — R519 Headache, unspecified: Secondary | ICD-10-CM | POA: Diagnosis not present

## 2023-11-04 DIAGNOSIS — G479 Sleep disorder, unspecified: Secondary | ICD-10-CM | POA: Diagnosis not present

## 2023-11-04 DIAGNOSIS — G4733 Obstructive sleep apnea (adult) (pediatric): Secondary | ICD-10-CM | POA: Diagnosis not present

## 2023-12-04 DIAGNOSIS — G4733 Obstructive sleep apnea (adult) (pediatric): Secondary | ICD-10-CM | POA: Diagnosis not present

## 2023-12-04 DIAGNOSIS — G479 Sleep disorder, unspecified: Secondary | ICD-10-CM | POA: Diagnosis not present

## 2024-01-14 DIAGNOSIS — G4733 Obstructive sleep apnea (adult) (pediatric): Secondary | ICD-10-CM | POA: Diagnosis not present

## 2024-01-14 DIAGNOSIS — G479 Sleep disorder, unspecified: Secondary | ICD-10-CM | POA: Diagnosis not present

## 2024-02-13 DIAGNOSIS — G4733 Obstructive sleep apnea (adult) (pediatric): Secondary | ICD-10-CM | POA: Diagnosis not present

## 2024-02-13 DIAGNOSIS — G479 Sleep disorder, unspecified: Secondary | ICD-10-CM | POA: Diagnosis not present

## 2024-03-15 DIAGNOSIS — G4733 Obstructive sleep apnea (adult) (pediatric): Secondary | ICD-10-CM | POA: Diagnosis not present

## 2024-03-15 DIAGNOSIS — G479 Sleep disorder, unspecified: Secondary | ICD-10-CM | POA: Diagnosis not present

## 2024-04-29 DIAGNOSIS — G479 Sleep disorder, unspecified: Secondary | ICD-10-CM | POA: Diagnosis not present

## 2024-04-29 DIAGNOSIS — G4733 Obstructive sleep apnea (adult) (pediatric): Secondary | ICD-10-CM | POA: Diagnosis not present

## 2024-05-30 DIAGNOSIS — G4733 Obstructive sleep apnea (adult) (pediatric): Secondary | ICD-10-CM | POA: Diagnosis not present

## 2024-06-19 DIAGNOSIS — G4733 Obstructive sleep apnea (adult) (pediatric): Secondary | ICD-10-CM | POA: Diagnosis not present

## 2024-06-29 DIAGNOSIS — G479 Sleep disorder, unspecified: Secondary | ICD-10-CM | POA: Diagnosis not present

## 2024-06-29 DIAGNOSIS — G4733 Obstructive sleep apnea (adult) (pediatric): Secondary | ICD-10-CM | POA: Diagnosis not present

## 2024-07-19 DIAGNOSIS — G4733 Obstructive sleep apnea (adult) (pediatric): Secondary | ICD-10-CM | POA: Diagnosis not present
